# Patient Record
Sex: Male | Born: 1965 | Race: White | Hispanic: No | Smoking: Former smoker
Health system: Southern US, Community
[De-identification: ages and names within clinical notes are randomized; demographics above are authoritative.]

## PROBLEM LIST (undated history)

## (undated) DIAGNOSIS — R030 Elevated blood-pressure reading, without diagnosis of hypertension: Secondary | ICD-10-CM

## (undated) DIAGNOSIS — K219 Gastro-esophageal reflux disease without esophagitis: Secondary | ICD-10-CM

## (undated) DIAGNOSIS — I10 Essential (primary) hypertension: Secondary | ICD-10-CM

## (undated) DIAGNOSIS — E785 Hyperlipidemia, unspecified: Secondary | ICD-10-CM

## (undated) DIAGNOSIS — R0600 Dyspnea, unspecified: Secondary | ICD-10-CM

## (undated) DIAGNOSIS — R7303 Prediabetes: Secondary | ICD-10-CM

## (undated) DIAGNOSIS — K200 Eosinophilic esophagitis: Secondary | ICD-10-CM

## (undated) DIAGNOSIS — F419 Anxiety disorder, unspecified: Secondary | ICD-10-CM

## (undated) DIAGNOSIS — M1990 Unspecified osteoarthritis, unspecified site: Secondary | ICD-10-CM

## (undated) HISTORY — DX: Eosinophilic esophagitis: K20.0

## (undated) HISTORY — DX: Gastro-esophageal reflux disease without esophagitis: K21.9

## (undated) HISTORY — DX: Essential (primary) hypertension: I10

## (undated) HISTORY — PX: KNEE SURGERY: SHX244

---

## 2011-07-29 ENCOUNTER — Emergency Department (HOSPITAL_COMMUNITY): Payer: BC Managed Care – PPO

## 2011-07-29 ENCOUNTER — Encounter (HOSPITAL_COMMUNITY): Payer: Self-pay | Admitting: *Deleted

## 2011-07-29 ENCOUNTER — Emergency Department (HOSPITAL_COMMUNITY)
Admission: EM | Admit: 2011-07-29 | Discharge: 2011-07-30 | Disposition: A | Discharge status: Home / Self Care | Payer: BC Managed Care – PPO | Attending: Gastroenterology | Admitting: Gastroenterology

## 2011-07-29 ENCOUNTER — Encounter (HOSPITAL_COMMUNITY): Admission: EM | Disposition: A | Discharge status: Home / Self Care | Payer: Self-pay | Source: Home / Self Care

## 2011-07-29 DIAGNOSIS — IMO0002 Reserved for concepts with insufficient information to code with codable children: Secondary | ICD-10-CM | POA: Insufficient documentation

## 2011-07-29 DIAGNOSIS — Z79899 Other long term (current) drug therapy: Secondary | ICD-10-CM | POA: Insufficient documentation

## 2011-07-29 DIAGNOSIS — K219 Gastro-esophageal reflux disease without esophagitis: Secondary | ICD-10-CM | POA: Insufficient documentation

## 2011-07-29 DIAGNOSIS — R070 Pain in throat: Secondary | ICD-10-CM | POA: Insufficient documentation

## 2011-07-29 DIAGNOSIS — T18108A Unspecified foreign body in esophagus causing other injury, initial encounter: Secondary | ICD-10-CM | POA: Insufficient documentation

## 2011-07-29 DIAGNOSIS — R1314 Dysphagia, pharyngoesophageal phase: Secondary | ICD-10-CM | POA: Insufficient documentation

## 2011-07-29 DIAGNOSIS — E785 Hyperlipidemia, unspecified: Secondary | ICD-10-CM | POA: Insufficient documentation

## 2011-07-29 HISTORY — DX: Hyperlipidemia, unspecified: E78.5

## 2011-07-29 HISTORY — DX: Elevated blood-pressure reading, without diagnosis of hypertension: R03.0

## 2011-07-29 HISTORY — PX: ESOPHAGOGASTRODUODENOSCOPY: SHX5428

## 2011-07-29 SURGERY — EGD (ESOPHAGOGASTRODUODENOSCOPY)
Anesthesia: Moderate Sedation

## 2011-07-29 MED ORDER — GLUCAGON HCL (RDNA) 1 MG IJ SOLR
1.0000 mg | Freq: Once | INTRAMUSCULAR | Status: AC
  Administered 2011-07-29: 1 mg via INTRAVENOUS
  Filled 2011-07-29: qty 1

## 2011-07-29 MED ORDER — MIDAZOLAM HCL 10 MG/2ML IJ SOLN
INTRAMUSCULAR | Status: AC
  Filled 2011-07-29: qty 2

## 2011-07-29 MED ORDER — FENTANYL CITRATE 0.05 MG/ML IJ SOLN
INTRAMUSCULAR | Status: AC
  Filled 2011-07-29: qty 2

## 2011-07-29 NOTE — ED Notes (Signed)
The pt was eating cube steak 1-2 hours ago and the meat became stuck inhis esophagus.  When he swallow water the water returns and will not go down.  He has had in the past

## 2011-07-29 NOTE — ED Notes (Signed)
Pt transported to radiology for xray, endoscopy here to take pt to endoscopy. Pt aware family aware. Endoscopy told to call back down for bed placement if necessary in ED once procedure over if not pt will be discharged per Endoscopy.

## 2011-07-29 NOTE — ED Notes (Signed)
Pt states that he was eating cube steak about 2 hours ago and the meat lodged in his throat. States that he stuck his finger down his throat in hopes that he could remove it but was unsuccessful. States that drinking water did not help either. States that when he drunk water it would just come back up. Pt states that this has happened 3-4 times in the past. States that he thought he chewed the meat well.

## 2011-07-29 NOTE — ED Notes (Signed)
Vital signs stable. 

## 2011-07-29 NOTE — ED Provider Notes (Signed)
I saw and evaluated the patient, reviewed the resident's note and I agree with the findings and plan.  Nicholes Stairs, MD 07/29/11 2320

## 2011-07-29 NOTE — ED Provider Notes (Signed)
History     CSN: [id number]  Arrival date & time 07/29/11  2045   First MD Initiated Contact with Patient 07/29/11 2113      Chief Complaint  Patient presents with  . Swallowed Foreign Body    (Consider location/radiation/quality/duration/timing/severity/associated sxs/prior treatment) HPI Comments: Patient was eating cubed steak at home when he felt the food get stuck in his throat. Tried swallowing some water to get it down but was unsuccessful.   Patient is a 46 y.o. male presenting with foreign body swallowed. The history is provided by the patient.  Swallowed Foreign Body This is a new problem. The current episode started today (about 1h 20 minutes prior to arrival). The problem occurs constantly. The problem has been unchanged. Associated symptoms include a sore throat (mild). Pertinent negatives include no abdominal pain, arthralgias, change in bowel habit, chest pain, chills, congestion, coughing, diaphoresis, fatigue, fever, headaches, nausea, neck pain, numbness, rash, vomiting or weakness. The symptoms are aggravated by drinking. He has tried drinking for the symptoms. The treatment provided no relief.    History reviewed. No pertinent past medical history.  History reviewed. No pertinent past surgical history.  History reviewed. No pertinent family history.  History  Substance Use Topics  . Smoking status: Former Games developer  . Smokeless tobacco: Not on file  . Alcohol Use: Yes      Review of Systems  Constitutional: Negative for fever, chills, diaphoresis, activity change, appetite change and fatigue.  HENT: Positive for sore throat (mild). Negative for congestion, rhinorrhea, neck pain and neck stiffness.   Eyes: Negative for photophobia, redness and visual disturbance.  Respiratory: Negative for cough, shortness of breath and wheezing.   Cardiovascular: Negative for chest pain, palpitations and leg swelling.  Gastrointestinal: Negative for nausea, vomiting,  abdominal pain and change in bowel habit.  Musculoskeletal: Negative for back pain and arthralgias.  Skin: Negative for rash and wound.  Neurological: Negative for speech difficulty, weakness, light-headedness, numbness and headaches.  Psychiatric/Behavioral: Negative for confusion.  All other systems reviewed and are negative.    Allergies  Simvastatin  Home Medications   Current Outpatient Rx  Name Route Sig Dispense Refill  . ATORVASTATIN CALCIUM 10 MG PO TABS Oral Take 20 mg by mouth at bedtime.      BP 144/105  Pulse 88  Temp(Src) 98 F (36.7 C) (Oral)  Resp 15  SpO2 100%  Physical Exam  Nursing note and vitals reviewed. Constitutional: He is oriented to person, place, and time. He appears well-developed and well-nourished.  Non-toxic appearance. No distress.  HENT:  Head: Normocephalic and atraumatic.  Mouth/Throat: Oropharynx is clear and moist.  Eyes: Conjunctivae and EOM are normal. Pupils are equal, round, and reactive to light. No scleral icterus.  Neck: Normal range of motion. Neck supple. No JVD present. No tracheal deviation present.  Cardiovascular: Normal rate, regular rhythm, normal heart sounds and intact distal pulses.   No murmur heard. Pulmonary/Chest: Effort normal and breath sounds normal. No stridor. No respiratory distress. He has no wheezes. He has no rales.  Abdominal: Soft. Bowel sounds are normal. He exhibits no distension. There is no tenderness. There is no rebound and no guarding.  Musculoskeletal: Normal range of motion.  Neurological: He is alert and oriented to person, place, and time. He has normal strength. No cranial nerve deficit. GCS eye subscore is 4. GCS verbal subscore is 5. GCS motor subscore is 6.  Skin: Skin is warm and dry. No rash noted. He is not diaphoretic.  Psychiatric: He has a normal mood and affect.    ED Course  Procedures (including critical care time)  Labs Reviewed - No data to display No results found.   1.  Esophageal foreign body       MDM  46yo CM with PMH significant for HLD and GERD who presents to the ED due to possible esophageal foreign body. Pt was eating dinner (some cubed steak) swallowed one piece and felt like he couldn't get it all the way down and it feels like it it stuck in his throat. Pt well-appearing on exam in no distress. Sats 99% on RA. Normal lung sounds and good air movement. No stridor. No difficulty handing his oral secretions. He has had several similar episodes in the past but was able to resolve at home with water.  Will attempt glucagon.   At 10:36 PM pt reassessed able to swallow some water with pain. Some water he spit back up but definitely was able to keep some down. Getting xray to r/o obstructing mass.   GI consulted.  At 11:00 PM spoke with GI. GI coming to evaluate in ED.      Verne Carrow, MD 07/29/11 [phone]

## 2011-07-29 NOTE — ED Provider Notes (Signed)
I saw and evaluated the patient, reviewed the resident's note and I agree with the findings and plan. The patient is a 46 year old, male, who complains of inability to swallow and drink water after he was eating steak tonight.  He has had intermittent mid chest discomfort, associated with this.  He denies chest pain at this time.  However.  He has not had a nausea, vomiting.  He denies any weight loss or night sweats.  He denies changes in his voice.  He has had this in the past, but that's gone away after his drinking water.  He has never been seen by a gastroenterologist, for this.  Following use of glucagon.  His symptoms have not been relieved.  Therefore, we insulted gastroenterology.  The gastroenterologist, will come, evaluate him in the emergency department  Nicholes Stairs, MD 07/29/11 2314

## 2011-07-29 NOTE — H&P (Signed)
  HPI:  46 yo wm with chronic heartburn sxs and intermittent dysphagia. He ate cube steak several hours ago and it became lodged in the esophagus, he has not been able to pass it. Uses intermittent ranitidine OTC for heartburn. Was given glucagon in the ED without relief.  Allergies  Allergen Reactions  . Simvastatin Other (See Comments)    Joint pain   @ENCMEDSTART @ Past Medical History  Diagnosis Date  . Hyperlipidemia    History reviewed. No pertinent past surgical history. History   Social History  . Marital Status: Married    Spouse Name: N/A    Number of Children: N/A  . Years of Education: N/A   Social History Main Topics  . Smoking status: Former Games developer  . Smokeless tobacco: None  . Alcohol Use: Yes             Wt Readings from Last 3 Encounters:  No data found for Wt   Temp Readings from Last 3 Encounters:  07/29/11 98 F (36.7 C) Oral  07/29/11 98 F (36.7 C) Oral   BP Readings from Last 3 Encounters:  07/29/11 144/105  07/29/11 144/105   Pulse Readings from Last 3 Encounters:  07/29/11 95  07/29/11 95    See pre-sedation exam for physical.  A/P:  Dysphagia, heartburn/GERD with food impaction obstructing esophagus.  Will perform urgent endoscopy and food impaction removal.  The risks and benefits as well as alternatives of endoscopic procedure(s) have been discussed and reviewed. All questions answered. The patient agrees to proceed.  Iva Boop, MD, [facility] [phone] (pager) 07/29/2011 11:53 PM

## 2011-07-30 ENCOUNTER — Encounter (HOSPITAL_COMMUNITY): Payer: Self-pay | Admitting: Internal Medicine

## 2011-07-30 ENCOUNTER — Other Ambulatory Visit: Payer: Self-pay | Admitting: Internal Medicine

## 2011-07-30 DIAGNOSIS — R1314 Dysphagia, pharyngoesophageal phase: Secondary | ICD-10-CM

## 2011-07-30 DIAGNOSIS — T18108A Unspecified foreign body in esophagus causing other injury, initial encounter: Secondary | ICD-10-CM

## 2011-07-30 MED ORDER — BUTAMBEN-TETRACAINE-BENZOCAINE 2-2-14 % EX AERO
INHALATION_SPRAY | CUTANEOUS | Status: DC | PRN
  Administered 2011-07-30: 2 via TOPICAL

## 2011-07-30 MED ORDER — OMEPRAZOLE MAGNESIUM 20 MG PO TBEC: 20.0000 mg | DELAYED_RELEASE_TABLET | Freq: Every day | ORAL | Status: DC

## 2011-07-30 MED ORDER — MIDAZOLAM HCL 10 MG/2ML IJ SOLN
INTRAMUSCULAR | Status: DC | PRN
  Administered 2011-07-30 (×2): 2 mg via INTRAVENOUS

## 2011-07-30 MED ORDER — FENTANYL NICU IV SYRINGE 50 MCG/ML
INJECTION | INTRAMUSCULAR | Status: DC | PRN
  Administered 2011-07-30: 25 ug via INTRAVENOUS
  Administered 2011-07-30: 50 ug via INTRAVENOUS

## 2011-07-30 NOTE — Brief Op Note (Signed)
07/29/2011 - 07/30/2011  12:32 AM  PATIENT:  Dominic Jackson  46 y.o. male  PRE-OPERATIVE DIAGNOSIS:  food impaction  POST-OPERATIVE DIAGNOSIS:  food impaction   PROCEDURE:  Procedure(s): Esophagoscopy with foreign body removal and biopsy  SURGEON:  Surgeon(s): Iva Boop, MD    Food impaction removed. No stricture seen. Biopsies taken because of findings suggestive of eosinophilic esophagitis.  He is to start a PPI and will followup with me in the office.

## 2011-07-30 NOTE — Op Note (Signed)
Upper endoscopy procedure report   Procedure: Esophagoscopy, foreign body removal, biopsies  Indications: Food impaction in the setting of recurrent dysphagia  Medications: Intravenous fentanyl 75 mcg, intravenous Versed 5 mg, topical Cetacaine spray  After informed consent the patient was given sedation, while in the left lateral decubitus position.  Description of procedure:  The Pentax video gastroscope was inserted under direct vision into the esophagus. There were several pieces of meat sitting in the esophagus. Using gentle pressure and manipulation with the gastroscope, I was able advanced these into the stomach. The proximal stomach was viewed and had some retained food but looked normal. Retroflexion in the stomach showed no abnormalities. The scope was withdrawn into the esophagus. I saw no obvious stricture. There was no significant trauma to the esophagus. There were multiple linear fissures, subtle rings, white dots. This was suggestive of eosinophilic esophagitis. Multiple biopsies were obtained and sent to pathology.  The scope was withdrawn and there were no immediate complications.  Note that there was a malfunction of the Pentax endoscopic reporting system and images were lost and reporting function was not possible.  Impression:  #1 successful removal of food impaction in the esophagus #2 possible eosinophilic esophagitis, biopsies pending  Plans:  #1 liquids only tonight then soft foods tomorrow. Chew and cut all food well. #2 start Prilosec OTC or equivalent over-the-counter medication 20 mg daily. #3 await biopsies, will arrange followup once I review those.  Iva Boop, MD, [facility] [phone] (pager) 07/30/2011 12:22 AM

## 2011-08-01 ENCOUNTER — Encounter (HOSPITAL_COMMUNITY): Payer: Self-pay | Admitting: Internal Medicine

## 2011-08-03 ENCOUNTER — Telehealth: Payer: Self-pay

## 2011-08-03 MED ORDER — OMEPRAZOLE 40 MG PO CPDR: 40.0000 mg | DELAYED_RELEASE_CAPSULE | Freq: Every day | ORAL | Status: DC

## 2011-08-03 NOTE — Telephone Encounter (Signed)
Left message for pt to call back. Cell [phone]  Pt aware of Dr. Marvell Fuller comments and recommendations. Rx sent to the pharmacy. Pt scheduled to see Dr. Leone Payor 08/19/11@11 :30am. Pt aware of appt date and time.

## 2011-08-03 NOTE — Telephone Encounter (Signed)
Message copied by Michele Mcalpine on Wed Aug 03, 2011  3:39 PM ------      Message from: Stan Head E      Created: Wed Aug 03, 2011  1:24 PM       It looks like he has eosinophilic esophagitis - please notify            He needs to get rx for omeprazole 40 mg daily #30 with 1 refill and schedule an office visit to discuss this with me and arrange possible allergy evaluation and to consider additional therapy      Chew food carefully and small bites/pieces, do not eat in a hurry

## 2011-08-19 ENCOUNTER — Ambulatory Visit (INDEPENDENT_AMBULATORY_CARE_PROVIDER_SITE_OTHER): Payer: BC Managed Care – PPO | Admitting: Internal Medicine

## 2011-08-19 ENCOUNTER — Encounter: Payer: Self-pay | Admitting: Internal Medicine

## 2011-08-19 VITALS — BP 136/82 | HR 80 | Ht 67.0 in | Wt 184.0 lb

## 2011-08-19 DIAGNOSIS — K200 Eosinophilic esophagitis: Secondary | ICD-10-CM

## 2011-08-19 DIAGNOSIS — K219 Gastro-esophageal reflux disease without esophagitis: Secondary | ICD-10-CM | POA: Insufficient documentation

## 2011-08-19 NOTE — Assessment & Plan Note (Signed)
His heartburn and GERD symptoms have resolved on PPI. It sounds like he has GERD and possible eosinophilic esophagitis. Sometimes it can be difficult to sort out the tube and there is definite overlap. I have advised him to continue PPI therapy on a chronic basis at this point I would like to see him back in a year routinely, sooner if he has recurrent dysphagia. There was no stricture seen at the time of his upper endoscopy so I elected not to proceed with further endoscopy given the overall findings. I told him he may need one in the future depending upon how things go.

## 2011-08-19 NOTE — Progress Notes (Signed)
Patient ID: Dominic Jackson, male   DOB: 01/05/1966, 46 y.o.   MRN: [id number]   This pleasant man returns after we first met because of a food impaction in the esophagus on 07/29/2011. He had heartburn and recurrent dysphagia and meat impactions in general. The endoscopy demonstrated findings that were suggestive of eosinophilic esophagitis. He was started on PPI therapy on a daily basis. He has had no recurrent dysphagia. Heartburn is under much better control, minimal if at all. He also describes previous episodes of water brash that have resolved. The biopsies returned with about 25 eosinophils per high power field, suggestive of eosinophilic esophagitis.  He denies other problems at this time. He drinks about one cup of coffee and sometimes one caffeinated soft drink a day. I've explained that these can increase reflux problems. He drinks 3-4 beers a night, and was advised to change that to one or 2 a night, but to alcoholic beverages daily should be a maximum for most man on a chronic basis. He does not smoke.  Medications, allergies and past medical history updated and reviewed in the PMR.  Filed Vitals:   08/19/11 1117  BP: 136/82  Pulse: 80

## 2011-08-19 NOTE — Assessment & Plan Note (Signed)
The pathology was consistent with this. As best I can tell he is responding well to PPI therapy alone. I have given him information about eosinophilic esophagitis and explained that it isn't allergic disorder, we are often unclear as to the origin though some foods could be implicated. It is often closely associated with acid reflux as well. Since he appears well on PPI therapy alone will continue that and monitor. If he develops recurrent dysphagia, treatment for eosinophilic esophagitis more specifically with topical steroids such as fluticasone ingested, will be considered. He could need an endoscopy and possible dilation and repeat biopsies as well.

## 2011-08-19 NOTE — Patient Instructions (Signed)
It sounds like you have acid reflux disease (GERD) and may also have something called eosinophilic esophagitis. Stay on the omeprazole. Please read the GERD and Eosinophilic Esophagitis information. If you develop recurrent swallowing problems please contact us. Otherwise return for follow-up in 1 year.

## 2011-09-27 ENCOUNTER — Other Ambulatory Visit: Payer: Self-pay | Admitting: Gastroenterology

## 2011-09-27 NOTE — Telephone Encounter (Signed)
This is your pt. I think.

## 2012-09-16 ENCOUNTER — Other Ambulatory Visit: Payer: Self-pay | Admitting: Internal Medicine

## 2012-10-03 ENCOUNTER — Ambulatory Visit (INDEPENDENT_AMBULATORY_CARE_PROVIDER_SITE_OTHER): Payer: BC Managed Care – PPO | Admitting: Internal Medicine

## 2012-10-03 ENCOUNTER — Encounter: Payer: Self-pay | Admitting: Internal Medicine

## 2012-10-03 VITALS — BP 120/78 | HR 80 | Ht 67.0 in | Wt 189.0 lb

## 2012-10-03 DIAGNOSIS — K200 Eosinophilic esophagitis: Secondary | ICD-10-CM

## 2012-10-03 DIAGNOSIS — K219 Gastro-esophageal reflux disease without esophagitis: Secondary | ICD-10-CM

## 2012-10-03 MED ORDER — OMEPRAZOLE 40 MG PO CPDR: 40.0000 mg | DELAYED_RELEASE_CAPSULE | Freq: Every day | ORAL | Status: DC

## 2012-10-03 NOTE — Progress Notes (Signed)
  Subjective:    Patient ID: Dominic Jackson, male    DOB: [birth year], 47 y.o.   MRN: [id number]  HPI The patient returns for follow-up of eosinophilic esophagitis and GERD. He is fine on omeprazole 40 mg daily. No dysphagia and no heartburn.  Medications, allergies, past medical history, past surgical history, family history and social history are reviewed and updated in the EMR.  Review of Systems As above    Objective:   Physical Exam WDWN NAD    Assessment & Plan:  Eosinophilic esophagitis - Plan: omeprazole (PRILOSEC) 40 MG capsule  GERD (gastroesophageal reflux disease) - Plan: omeprazole (PRILOSEC) 40 MG capsule  He can see me as needed and obtain PPI through PCP. Will plan to contact him after he turns 49 re: scheduling a screening colonoscopy.  I appreciate the opportunity to care for this patient.  NW:[id number],YQMVHQIO, MD

## 2012-10-03 NOTE — Patient Instructions (Signed)
Glad things are going well. See me back as needed and after you turn 49 (for colonoscopy). Will try to send you a letter after you turn 49.  You should be able to get the omeprazole through Dr. Barbaraann Barthel in the future.   Thank you for choosing me and [facility].  Iva Boop, MD, [facility]

## 2012-10-26 ENCOUNTER — Other Ambulatory Visit: Payer: Self-pay | Admitting: Internal Medicine

## 2015-06-28 ENCOUNTER — Ambulatory Visit (HOSPITAL_COMMUNITY)
Admission: EM | Admit: 2015-06-28 | Discharge: 2015-06-29 | Disposition: A | Discharge status: Home / Self Care | Payer: BLUE CROSS/BLUE SHIELD | Attending: Emergency Medicine | Admitting: Emergency Medicine

## 2015-06-28 ENCOUNTER — Encounter (HOSPITAL_COMMUNITY)
Admission: EM | Disposition: A | Discharge status: Home / Self Care | Payer: Self-pay | Source: Home / Self Care | Attending: Emergency Medicine

## 2015-06-28 ENCOUNTER — Encounter (HOSPITAL_COMMUNITY): Payer: Self-pay | Admitting: Family Medicine

## 2015-06-28 DIAGNOSIS — R1310 Dysphagia, unspecified: Secondary | ICD-10-CM

## 2015-06-28 DIAGNOSIS — K200 Eosinophilic esophagitis: Secondary | ICD-10-CM | POA: Diagnosis not present

## 2015-06-28 DIAGNOSIS — M1990 Unspecified osteoarthritis, unspecified site: Secondary | ICD-10-CM | POA: Insufficient documentation

## 2015-06-28 DIAGNOSIS — Z79899 Other long term (current) drug therapy: Secondary | ICD-10-CM | POA: Insufficient documentation

## 2015-06-28 DIAGNOSIS — E785 Hyperlipidemia, unspecified: Secondary | ICD-10-CM | POA: Diagnosis not present

## 2015-06-28 DIAGNOSIS — K2210 Ulcer of esophagus without bleeding: Secondary | ICD-10-CM | POA: Diagnosis not present

## 2015-06-28 DIAGNOSIS — R1314 Dysphagia, pharyngoesophageal phase: Secondary | ICD-10-CM | POA: Diagnosis not present

## 2015-06-28 DIAGNOSIS — Z87891 Personal history of nicotine dependence: Secondary | ICD-10-CM | POA: Insufficient documentation

## 2015-06-28 DIAGNOSIS — K219 Gastro-esophageal reflux disease without esophagitis: Secondary | ICD-10-CM | POA: Diagnosis not present

## 2015-06-28 DIAGNOSIS — K449 Diaphragmatic hernia without obstruction or gangrene: Secondary | ICD-10-CM | POA: Diagnosis not present

## 2015-06-28 DIAGNOSIS — I10 Essential (primary) hypertension: Secondary | ICD-10-CM | POA: Insufficient documentation

## 2015-06-28 DIAGNOSIS — R1319 Other dysphagia: Secondary | ICD-10-CM

## 2015-06-28 DIAGNOSIS — K222 Esophageal obstruction: Secondary | ICD-10-CM | POA: Insufficient documentation

## 2015-06-28 DIAGNOSIS — T18128A Food in esophagus causing other injury, initial encounter: Secondary | ICD-10-CM | POA: Diagnosis not present

## 2015-06-28 HISTORY — PX: ESOPHAGOGASTRODUODENOSCOPY: SHX5428

## 2015-06-28 HISTORY — DX: Unspecified osteoarthritis, unspecified site: M19.90

## 2015-06-28 SURGERY — EGD (ESOPHAGOGASTRODUODENOSCOPY)
Anesthesia: Moderate Sedation

## 2015-06-28 MED ORDER — MIDAZOLAM HCL 5 MG/ML IJ SOLN
INTRAMUSCULAR | Status: AC
  Filled 2015-06-28: qty 2

## 2015-06-28 MED ORDER — MIDAZOLAM HCL 10 MG/2ML IJ SOLN
INTRAMUSCULAR | Status: DC | PRN
  Administered 2015-06-28: 2 mg via INTRAVENOUS
  Administered 2015-06-28 (×5): 1 mg via INTRAVENOUS
  Administered 2015-06-28: 2 mg via INTRAVENOUS

## 2015-06-28 MED ORDER — FENTANYL CITRATE (PF) 100 MCG/2ML IJ SOLN
INTRAMUSCULAR | Status: AC
  Filled 2015-06-28: qty 4

## 2015-06-28 MED ORDER — FENTANYL CITRATE (PF) 100 MCG/2ML IJ SOLN
INTRAMUSCULAR | Status: DC | PRN
  Administered 2015-06-28: 25 ug via INTRAVENOUS
  Administered 2015-06-28 (×2): 12.5 ug via INTRAVENOUS
  Administered 2015-06-28 (×2): 25 ug via INTRAVENOUS

## 2015-06-28 MED ORDER — DIPHENHYDRAMINE HCL 50 MG/ML IJ SOLN
INTRAMUSCULAR | Status: AC
  Filled 2015-06-28: qty 1

## 2015-06-28 MED ORDER — DIPHENHYDRAMINE HCL 50 MG/ML IJ SOLN
INTRAMUSCULAR | Status: DC | PRN
  Administered 2015-06-28: 25 mg via INTRAVENOUS

## 2015-06-28 NOTE — H&P (Signed)
Patient ID: Dominic Jackson, male DOB: 07-17-1965 MRN: [id number]  HPI The [facility] for acute dysphagia while eating a pork chop about 4 pm today. Unable to handle secretions until [facility]. He has eosinophilic esophagitis and GERD. He has not been on any treatment for either for the past years. He last saw Dr. Leone PayorGessner in early 2104. Notes frequent heartburn recently and has take Pepcid as as needed. Notes solid food dysphagia a couple times over the past few weeks.   Medications, allergies, past medical history, past surgical history, family history and social history are reviewed and updated in the EMR.  Review of Systems Dominic Jackson    Objective:  Physical Exam WDWN NAD HEENT: negative Chest: CTA Cor: RRR without murmurs Abd: soft, NT, ND, +BS Neuro: A+0 x 3. Alert,appropriate.    Assessment & Plan:  Acute dysphagia. Suspected food impaction. Urgent EGD.   Eosinophilic esophagitis - Follow up with Dr. Leone PayorGessner  GERD (gastroesophageal reflux disease) - Plan: omeprazole (PRILOSEC) OTC 20 MG capsule and follow up with Dr. Leone PayorGessner.

## 2015-06-28 NOTE — ED Notes (Signed)
Patient was transferred [facility] for consult with endoscopy due to patient having a piece of porkchop stuck in throat. Prior to transport, patient failed po challenge and had GLUCAGON 1 mg IM with no relief.

## 2015-06-28 NOTE — ED Notes (Signed)
Bed: [id number] Expected date:  Expected time:  Means of arrival:  Comments: Endo

## 2015-06-28 NOTE — ED Provider Notes (Signed)
CSN: [id number]     Arrival date & time 06/28/15  2125 History   First MD Initiated Contact with Patient 06/28/15 2132     Chief Complaint  Patient presents with  . Foreign Body     (Consider location/radiation/quality/duration/timing/severity/associated sxs/prior Treatment) HPI Comments: Patient is a 49 year old male with a history of hypertension Hyperlipidemia who presented to [facility] tonight with a food bolus impaction. He states he was eating pork chop around 6 PM and when he swallowed the food got caught in his throat. He has been unable to swallow his saliva or any liquids since. He continues to regurgitate his saliva. He is getting glucagon 1 mg IM there without relief.  He states he had one similar episode a proximally 2 years ago where he received an endoscopy and was found to have relation in the area where the food was stuck but never had to be stretched. Over the last 2 weeks he's had some intermittent episodes of difficulty swallowing and has noticed some new heartburn symptoms. He has started taking over-the-counter antacids. Patient denies any tobacco use but does drink 3-4 cans of beer per night.  The history is provided by the patient.    Past Medical History  Diagnosis Date  . Hyperlipidemia   . Borderline hypertension   . Eosinophilic esophagitis   . GERD (gastroesophageal reflux disease)   . Arthritis    Past Surgical History  Procedure Laterality Date  . Esophagogastroduodenoscopy  07/29/2011    Esophagoscopy with removal of food impaction, biopsies suggested eosinophilic esophagitis  . Knee surgery Right    Family History  Problem Relation Age of Onset  . Cancer Maternal Grandmother   . Skin cancer Father   . Diabetes Maternal Aunt   . Heart disease Maternal Grandmother    Social History  Substance Use Topics  . Smoking status: Former Games developermoker  . Smokeless tobacco: Never Used  . Alcohol Use: 0.0 oz/week    3-4 Cans of beer per week   Comment: 3-4 cans of beer a night    Review of Systems  All other systems reviewed and are negative.     Allergies  Simvastatin  Home Medications   Prior to Admission medications   Medication Sig Start Date End Date Taking? Authorizing Provider  atorvastatin (LIPITOR) 10 MG tablet Take 20 mg by mouth at bedtime.    Historical Provider, MD  losartan-hydrochlorothiazide (HYZAAR) 50-12.5 MG per tablet Take 1 tablet by mouth daily.    Historical Provider, MD  meloxicam (MOBIC) 15 MG tablet Take 15 mg by mouth daily.    Historical Provider, MD  omeprazole (PRILOSEC) 40 MG capsule Take 1 capsule (40 mg total) by mouth daily before breakfast. 10/03/12   Iva Booparl E Gessner, MD  omeprazole (PRILOSEC) 40 MG capsule TAKE ONE CAPSULE BY MOUTH EVERY DAY 10/26/12   Iva Booparl E Gessner, MD  traMADol (ULTRAM) 50 MG tablet Take 50 mg by mouth every 6 (six) hours as needed for pain.    Historical Provider, MD   BP 147/93 mmHg  Pulse 86  Temp(Src) 98.3 F (36.8 C) (Oral)  Resp 20  Ht 5\' 6"  (1.676 m)  Wt 180 lb (81.647 kg)  BMI 29.07 kg/m2  SpO2 95% Physical Exam  Constitutional: He is oriented to person, place, and time. He appears well-developed and well-nourished. No distress.  HENT:  Head: Normocephalic and atraumatic.  Mouth/Throat: Oropharynx is clear and moist.  Eyes: Conjunctivae and EOM are normal. Pupils are equal, round, and  reactive to light.  Neck: Normal range of motion. Neck supple.  Cardiovascular: Normal rate, regular rhythm and intact distal pulses.   No murmur heard. Pulmonary/Chest: Effort normal and breath sounds normal. No respiratory distress. He has no wheezes. He has no rales.  Abdominal: Soft. He exhibits no distension. There is no tenderness. There is no rebound and no guarding.  Musculoskeletal: Normal range of motion. He exhibits no edema or tenderness.  Neurological: He is alert and oriented to person, place, and time.  Skin: Skin is warm and dry. No rash noted. No  erythema.  Psychiatric: He has a normal mood and affect. His behavior is normal.  Nursing note and vitals reviewed.   ED Course  Procedures (including critical care time) Labs Review Labs Reviewed - No data to display  Imaging Review No results found. I have personally reviewed and evaluated these images and lab results as part of my medical decision-making.   EKG Interpretation None      MDM   Final diagnoses:  None    Patient being sent here from [facility] for food bolus impaction. Patient was eating a pork chop tonight at 6 PM and has been unable to swallow since. He is still regurgitating all of his saliva despite getting glucagon at [facility]. He otherwise has no respiratory complaint is in no acute distress. Spoke with GI who will come and see the patient.    Gwyneth Sprout, MD 06/28/15 2211

## 2015-06-28 NOTE — Op Note (Signed)
[facility] [address] [place], [id number]   ENDOSCOPY PROCEDURE REPORT  PATIENT: Dominic Jackson, Dominic Jackson  MR#: [id number] BIRTHDATE: Sep 24, 1965 , 49  yrs. old GENDER: male ENDOSCOPIST: Meryl DareMalcolm T Klinton Candelas, MD, [facility] REFERRED BY:  Cynda AcresWLED PROCEDURE DATE:  06/28/2015 PROCEDURE:  EGD, diagnostic ASA CLASS:     Class II INDICATIONS:  dysphagia and food impaction. MEDICATIONS: Benadryl 25 mg IV, Fentanyl 100 mcg IV, and Versed 9 mg IV TOPICAL ANESTHETIC: none DESCRIPTION OF PROCEDURE: After the risks benefits and alternatives of the procedure were thoroughly explained, informed consent was obtained.  The Pentax Gastroscope [id number] endoscope was introduced through the mouth and advanced to the second portion of the duodenum , Without limitations.  The instrument was slowly withdrawn as the mucosa was fully examined.    ESOPHAGUS: Two erosions were found in the distal esophagus.  There was a benign appearing stricture at the gastroesophageal junction. The stricture was easily traversable.  No food impaction, likely passed in [facility]. STOMACH: The mucosa of the stomach appeared normal. DUODENUM: The duodenal mucosa showed no abnormalities in the bulb and 2nd part of the duodenum.  Retroflexed views revealed a small hiatal hernia.  The scope was then withdrawn from the patient and the procedure completed.  COMPLICATIONS: There were no immediate complications.  ENDOSCOPIC IMPRESSION: 1.   Two erosions in the distal esophagus 2.   Stricture at the gastroesophageal junction 3.   Small hiatal hernia  RECOMMENDATIONS: 1.  Anti-reflux regimen long term 2.  PPI qam: Prilosec OTC omeprazole 20 mg po qam 3.  Soft foods only, no meats or bread 4.  OP follow-up with Dr. Leone PayorGessner  eSigned:  Meryl DareMalcolm T Zaleah Ternes, MD, [facility] 06/28/2015 11:42 PM   CC: Stan Headarl Gessner, MD

## 2015-06-28 NOTE — ED Notes (Signed)
Endo at bedside

## 2015-06-29 ENCOUNTER — Encounter (HOSPITAL_COMMUNITY): Payer: Self-pay | Admitting: Gastroenterology

## 2015-06-29 NOTE — ED Notes (Signed)
Patient resting with eyes shut.

## 2015-06-29 NOTE — ED Notes (Signed)
Pollina, MD at bedside 

## 2015-06-29 NOTE — ED Notes (Signed)
Discharge instructions reviewed with patient and wife. Patient and wife verbalized understanding.

## 2015-06-29 NOTE — ED Provider Notes (Signed)
Recheck at 01:12  Patient awake and alert, appropriate for discharge status post sedation for endoscopy.  Gilda Creasehristopher J Pollina, MD 06/29/15 [phone]

## 2015-06-29 NOTE — Discharge Instructions (Signed)
Esophagogastroduodenoscopy °Esophagogastroduodenoscopy (EGD) is a procedure that is used to examine the lining of the esophagus, stomach, and first part of the small intestine (duodenum). A long, flexible, lighted tube with a camera attached (endoscope) is inserted down the throat to view these organs. This procedure is done to detect problems or abnormalities, such as inflammation, bleeding, ulcers, or growths, in order to treat them. The procedure lasts 5-20 minutes. It is usually an outpatient procedure, but it may need to be performed in a hospital in emergency cases. °LET YOUR HEALTH CARE PROVIDER KNOW ABOUT: °· Any allergies you have. °· All medicines you are taking, including vitamins, herbs, eye drops, creams, and over-the-counter medicines. °· Previous problems you or members of your family have had with the use of anesthetics. °· Any blood disorders you have. °· Previous surgeries you have had. °· Medical conditions you have. °RISKS AND COMPLICATIONS °Generally, this is a safe procedure. However, problems can occur and include: °· Infection. °· Bleeding. °· Tearing (perforation) of the esophagus, stomach, or duodenum. °· Difficulty breathing or not being able to breathe. °· Excessive sweating. °· Spasms of the larynx. °· Slowed heartbeat. °· Low blood pressure. °BEFORE THE PROCEDURE °· Do not eat or drink anything after midnight on the night before the procedure or as directed by your health care provider. °· Do not take your regular medicines before the procedure if your health care provider asks you not to. Ask your health care provider about changing or stopping those medicines. °· If you wear dentures, be prepared to remove them before the procedure. °· Arrange for someone to drive you home after the procedure. °PROCEDURE °· A numbing medicine (local anesthetic) may be sprayed in your throat for comfort and to stop you from gagging or coughing. °· You will have an IV tube inserted in a vein in your  hand or arm. You will receive medicines and fluids through this tube. °· You will be given a medicine to relax you (sedative). °· A pain reliever will be given through the IV tube. °· A mouth guard may be placed in your mouth to protect your teeth and to keep you from biting on the endoscope. °· You will be asked to lie on your left side. °· The endoscope will be inserted down your throat and into your esophagus, stomach, and duodenum. °· Air will be put through the endoscope to allow your health care provider to clearly view the lining of your esophagus. °· The lining of your esophagus, stomach, and duodenum will be examined. During the exam, your health care provider may: °¨ Remove tissue to be examined under a microscope (biopsy) for inflammation, infection, or other medical problems. °¨ Remove growths. °¨ Remove objects (foreign bodies) that are stuck. °¨ Treat any bleeding with medicines or other devices that stop tissues from bleeding (hot cautery, clipping devices). °¨ Widen (dilate) or stretch narrowed areas of your esophagus and stomach. °· The endoscope will be withdrawn. °AFTER THE PROCEDURE °· You will be taken to a recovery area for observation. Your blood pressure, heart rate, breathing rate, and blood oxygen level will be monitored often until the medicines you were given have worn off. °· Do not eat or drink anything until the numbing medicine has worn off and your gag reflex has returned. You may choke. °· Your health care provider should be able to discuss his or her findings with you. It will take longer to discuss the test results if any biopsies were taken. °  °  This information is not intended to replace advice given to you by your health care provider. Make sure you discuss any questions you have with your health care provider. °  °Document Released: 10/28/2004 Document Revised: 07/18/2014 Document Reviewed: 05/30/2012 °Elsevier Interactive Patient Education ©2016 [employer] ° °

## 2015-07-01 ENCOUNTER — Encounter: Payer: Self-pay | Admitting: Internal Medicine

## 2015-07-01 ENCOUNTER — Ambulatory Visit (INDEPENDENT_AMBULATORY_CARE_PROVIDER_SITE_OTHER): Payer: BLUE CROSS/BLUE SHIELD | Admitting: Internal Medicine

## 2015-07-01 VITALS — BP 120/90 | HR 84 | Ht 67.0 in | Wt 192.0 lb

## 2015-07-01 DIAGNOSIS — K219 Gastro-esophageal reflux disease without esophagitis: Secondary | ICD-10-CM | POA: Diagnosis not present

## 2015-07-01 DIAGNOSIS — K222 Esophageal obstruction: Secondary | ICD-10-CM

## 2015-07-01 DIAGNOSIS — K200 Eosinophilic esophagitis: Secondary | ICD-10-CM

## 2015-07-01 MED ORDER — PANTOPRAZOLE SODIUM 40 MG PO TBEC: 40.0000 mg | DELAYED_RELEASE_TABLET | Freq: Every day | ORAL | Status: DC

## 2015-07-01 NOTE — Progress Notes (Signed)
   Subjective:    Patient ID: Dominic Jackson, male    DOB: [birth year], 49 y.o.   MRN: [id number] Chief complaint: Dysphagia and recent food impaction HPI The patient is a 49 year old white man I know from previous exam an upper endoscopy. He had suspected GERD plus minus eosinophilic esophagitis. He had a food impaction a few days ago and this was treated by Dr. Russella DarStark. A stricture and some erosions are seen in the distal esophagus. The patient had stopped taking his PPI. He thought that omeprazole is causing him to have a clear white phlegm produced from his esophagus. He was taking Pepcid instead. Prior to the food impaction he did notice intermittent increased dysphagia. Wt Readings from Last 3 Encounters:  07/01/15 192 lb (87.091 kg)  06/28/15 180 lb (81.647 kg)  10/03/12 189 lb (85.73 kg)  Medications, allergies, past medical history, past surgical history, family history and social history are reviewed and updated in the EMR.    Review of Systems As above    Objective:   Physical Exam BP 120/90 mmHg  Pulse 84  Ht 5\' 7"  (1.702 m)  Wt 192 lb (87.091 kg)  BMI 30.06 kg/m2    Assessment & Plan:   1. GERD with stricture   2. Eosinophilic esophagitis    Restart PPI, pantoprazole 40 mg daily. Reemphasized the importance of staying on one of these medications to avoid recurrent dysphagia and stricture formation. Also helps if he does have eosinophilic esophagitis Schedule upper GI endoscopy with likely esophageal dilation possible repeat biopsies The risks and benefits as well as alternatives of endoscopic procedure(s) have been discussed and reviewed. All questions answered. The patient agrees to proceed.  Chew carefully and the interim  He is 49, we will call him and offer the chance to do a colonoscopy for screening at the time of his EGD. I did not make that connection when he was here in the office today.  I appreciate the opportunity to care for this patient. CC: Clayborn HeronVictoria R  Rankins, MD

## 2015-07-01 NOTE — Patient Instructions (Signed)
  You have been scheduled for an endoscopy. Please follow written instructions given to you at your visit today. If you use inhalers (even only as needed), please bring them with you on the day of your procedure. Your physician has requested that you go to www.startemmi.com and enter the access code given to you at your visit today. This web site gives a general overview about your procedure. However, you should still follow specific instructions given to you by our office regarding your preparation for the procedure.   We have sent the following medications to your pharmacy for you to pick up at your convenience: Pantoprazole  Chew your food carefully.   I appreciate the opportunity to care for you. Stan Headarl Gessner, MD, [facility]

## 2015-07-02 ENCOUNTER — Telehealth: Payer: Self-pay

## 2015-07-02 NOTE — Telephone Encounter (Signed)
Patient said he did a stool test  for blood for the [employer]. And it was negative so they told him he didn't need a colonoscopy.  This was done in June 2016.  He will do what ever you advise Sir.

## 2015-07-02 NOTE — Telephone Encounter (Signed)
-----   Message from Iva Booparl E Gessner, MD sent at 07/01/2015  6:03 PM EST ----- Regarding: colonoscopy also? I realized that the time of dictation he is 49 and has not had a colonoscopy  He will be having an EGD. If he wants we could add on a screening colonoscopy. Thanks

## 2015-07-02 NOTE — Telephone Encounter (Signed)
That is ok for this year he is right so he can do the stool test again next year or start colonoscopy next year - [place] [facility]

## 2015-07-02 NOTE — Telephone Encounter (Signed)
Left patient message to call me back to see if he wants to do a colonoscopy.

## 2015-07-02 NOTE — Telephone Encounter (Signed)
Patient informed. 

## 2015-07-15 ENCOUNTER — Encounter: Payer: Self-pay | Admitting: Internal Medicine

## 2015-07-15 ENCOUNTER — Ambulatory Visit (AMBULATORY_SURGERY_CENTER): Payer: BLUE CROSS/BLUE SHIELD | Admitting: Internal Medicine

## 2015-07-15 VITALS — BP 129/90 | HR 65 | Temp 98.0°F | Resp 28 | Ht 67.0 in | Wt 192.0 lb

## 2015-07-15 DIAGNOSIS — K200 Eosinophilic esophagitis: Secondary | ICD-10-CM | POA: Diagnosis not present

## 2015-07-15 DIAGNOSIS — K219 Gastro-esophageal reflux disease without esophagitis: Secondary | ICD-10-CM

## 2015-07-15 DIAGNOSIS — K222 Esophageal obstruction: Secondary | ICD-10-CM

## 2015-07-15 MED ORDER — SODIUM CHLORIDE 0.9 % IV SOLN: 500.0000 mL | INTRAVENOUS | Status: DC

## 2015-07-15 NOTE — Patient Instructions (Addendum)
I took biopsies of the esophagus and dilated it to avoid future swallowing problems.  PLEASE STAY ON PANTOPRAZOLE  I will let you know pathology results and follow-up plans - it looks like you still have the eosinophilic esophagitis.  I appreciate the opportunity to care for you. Iva Boop, MD, FACG  YOU HAD AN ENDOSCOPIC PROCEDURE TODAY AT THE  ENDOSCOPY CENTER:   Refer to the procedure report that was given to you for any specific questions about what was found during the examination.  If the procedure report does not answer your questions, please call your gastroenterologist to clarify.  If you requested that your care partner not be given the details of your procedure findings, then the procedure report has been included in a sealed envelope for you to review at your convenience later.  YOU SHOULD EXPECT: Some feelings of bloating in the abdomen. Passage of more gas than usual.  Walking can help get rid of the air that was put into your GI tract during the procedure and reduce the bloating. If you had a lower endoscopy (such as a colonoscopy or flexible sigmoidoscopy) you may notice spotting of blood in your stool or on the toilet paper. If you underwent a bowel prep for your procedure, you may not have a normal bowel movement for a few days.  Please Note:  You might notice some irritation and congestion in your nose or some drainage.  This is from the oxygen used during your procedure.  There is no need for concern and it should clear up in a day or so.  SYMPTOMS TO REPORT IMMEDIATELY:   Following lower endoscopy (colonoscopy or flexible sigmoidoscopy):  Excessive amounts of blood in the stool  Significant tenderness or worsening of abdominal pains  Swelling of the abdomen that is new, acute  Fever of 100F or higher   Following upper endoscopy (EGD)  Vomiting of blood or coffee ground material  New chest pain or pain under the shoulder blades  Painful or  persistently difficult swallowing  New shortness of breath  Fever of 100F or higher  Black, tarry-looking stools  For urgent or emergent issues, a gastroenterologist can be reached at any hour by calling [phone].   DIET: Your first meal following the procedure should be a small meal and then it is ok to progress to your normal diet. Heavy or fried foods are harder to digest and may make you feel nauseous or bloated.  Likewise, meals heavy in dairy and vegetables can increase bloating.  Drink plenty of fluids but you should avoid alcoholic beverages for 24 hours.  ACTIVITY:  You should plan to take it easy for the rest of today and you should NOT DRIVE or use heavy machinery until tomorrow (because of the sedation medicines used during the test).    FOLLOW UP: Our staff will call the number listed on your records the next business day following your procedure to check on you and address any questions or concerns that you may have regarding the information given to you following your procedure. If we do not reach you, we will leave a message.  However, if you are feeling well and you are not experiencing any problems, there is no need to return our call.  We will assume that you have returned to your regular daily activities without incident.  If any biopsies were taken you will be contacted by phone or by letter within the next 1-3 weeks.  Please call  us at [phone] if you have not heard about the biopsies in 3 weeks.    SIGNATURES/CONFIDENTIALITY: You and/or your care partner have signed paperwork which will be entered into your electronic medical record.  These signatures attest to the fact that that the information above on your After Visit Summary has been reviewed and is understood.  Full responsibility of the confidentiality of this discharge information lies with you and/or your care-partner.  After dilation diet given with instructions.  Continue PPI  If donig welll can  likely stay on pantoprazole and RTC one year.  Esophagitis information given.

## 2015-07-15 NOTE — Progress Notes (Signed)
Called to room to assist during endoscopic procedure.  Patient ID and intended procedure confirmed with present staff. Received instructions for my participation in the procedure from the performing physician.  

## 2015-07-15 NOTE — Op Note (Signed)
[facility] [address] [place], [id number]   ENDOSCOPY PROCEDURE REPORT  PATIENT: Delorse LimberMoorefield, Urijah  MR#: [id number] BIRTHDATE: Nov 26, 1965 , 49  yrs. old GENDER: male ENDOSCOPIST: Iva Booparl E Gessner, MD, [facility] PROCEDURE DATE:  07/15/2015 PROCEDURE:  EGD w/ biopsy and Maloney dilation of esophagus ASA CLASS:     Class II INDICATIONS:  dysphagia, hx GERD/Eosinophilic esophagitis + stricture. MEDICATIONS: Propofol 200 mg IV and Monitored anesthesia care TOPICAL ANESTHETIC: none  DESCRIPTION OF PROCEDURE: After the risks benefits and alternatives of the procedure were thoroughly explained, informed consent was obtained.  The LB [id number] endoscope was introduced through the mouth and advanced to the second portion of the duodenum , Without limitations.  The instrument was slowly withdrawn as the mucosa was fully examined.    1) Fissures and furrows in esophagus and a few distal rings - consistent w/ eosinophilic esophagitis.  Biopsies taken and 54 Fr Maloney dilator easily passed + heme after bxs.  Appropriate dilation effect seen in distal esophagus on re-inspection. 2) Remainder of exam normal.  Retroflexed views revealed no abnormalities.     The scope was then withdrawn from the patient and the procedure completed.  COMPLICATIONS: There were no immediate complications.  ENDOSCOPIC IMPRESSION: 1) Fissures and furrows in esophagus and a few distal rings - consistent w/ eosinophilic esophagitis.  Biopsies taken and 54 Fr Maloney dilator easily passed + heme after bxs.  Appropriate dilation effect seen in distal esophagus on re-inspection. 2) Remainder of exam normal  RECOMMENDATIONS: 1.  Clear liquids until 4PM  , then soft foods rest of day.  Resume prior diet tomorrow. 2.  Continue PPI 3.  If doing well can likely stay on pantoprazole and RTC 1 yr   eSigned:  Iva Booparl E Gessner, MD, [facility] 07/15/2015 2:44 PM    CC:The Patient and Beverley FiedlerVictoria  Rankins, MD

## 2015-07-15 NOTE — Progress Notes (Signed)
A/ox3 pleased with MAC, report to Jane RN 

## 2015-07-16 ENCOUNTER — Telehealth: Payer: Self-pay | Admitting: *Deleted

## 2015-07-16 NOTE — Telephone Encounter (Signed)
  Follow up Call-  Call back number 07/15/2015  Post procedure Call Back phone  # [phone]  Permission to leave phone message Yes     Patient questions:  Do you have a fever, pain , or abdominal swelling? No. Pain Score  0 *  Have you tolerated food without any problems? Yes.    Have you been able to return to your normal activities? Yes.    Do you have any questions about your discharge instructions: Diet   No. Medications  No. Follow up visit  No.  Do you have questions or concerns about your Care? No.  Actions: * If pain score is 4 or above: No action needed, pain <4.

## 2015-07-22 ENCOUNTER — Encounter: Payer: Self-pay | Admitting: Internal Medicine

## 2015-07-22 NOTE — Progress Notes (Signed)
Quick Note:  Looks like EoE - stay on PPI See me 1 year but return sooner if still having swallowing difficulty Letter out to patient ______

## 2015-08-10 ENCOUNTER — Encounter: Payer: Self-pay | Admitting: Internal Medicine

## 2015-11-25 IMAGING — CR DG SHOULDER 2+V*R*
3 series · 3 of 3 positions shown · non-contrast
Comparison: None.

CLINICAL DATA: Right shoulder injury yesterday.

EXAM:
RIGHT SHOULDER - 2+ VIEW

[AP]
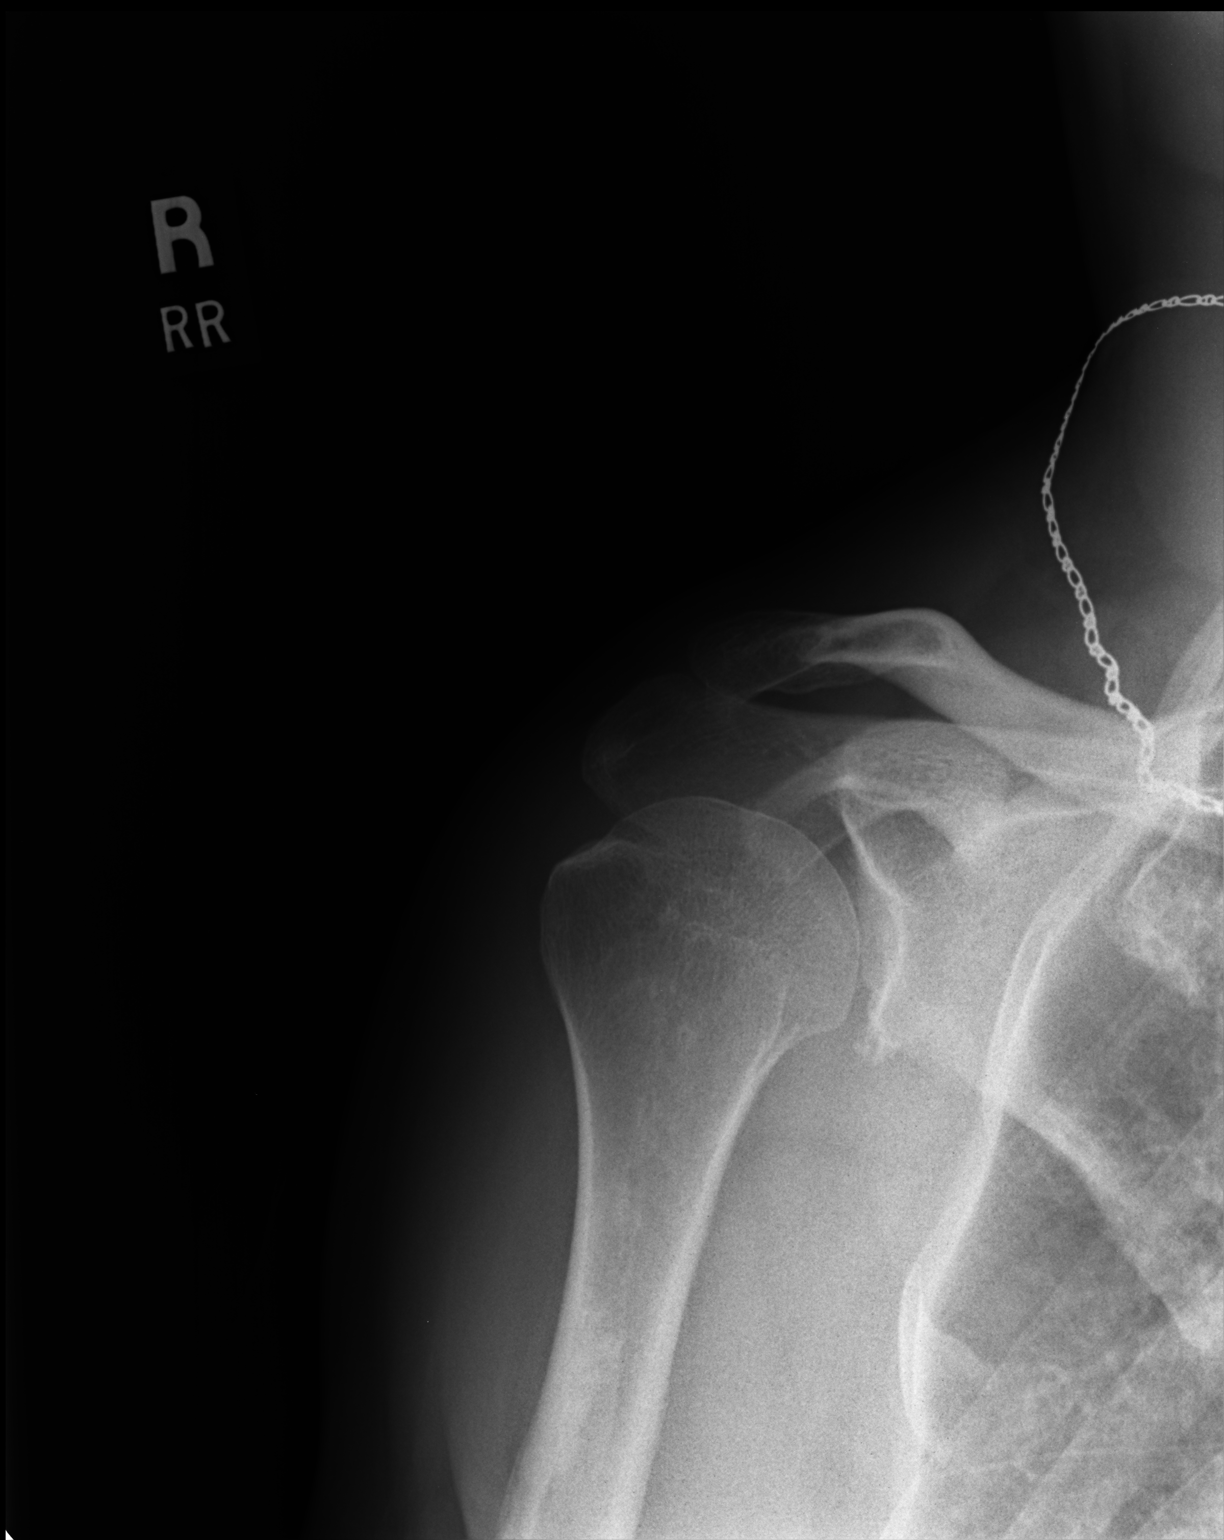

[pa y view]
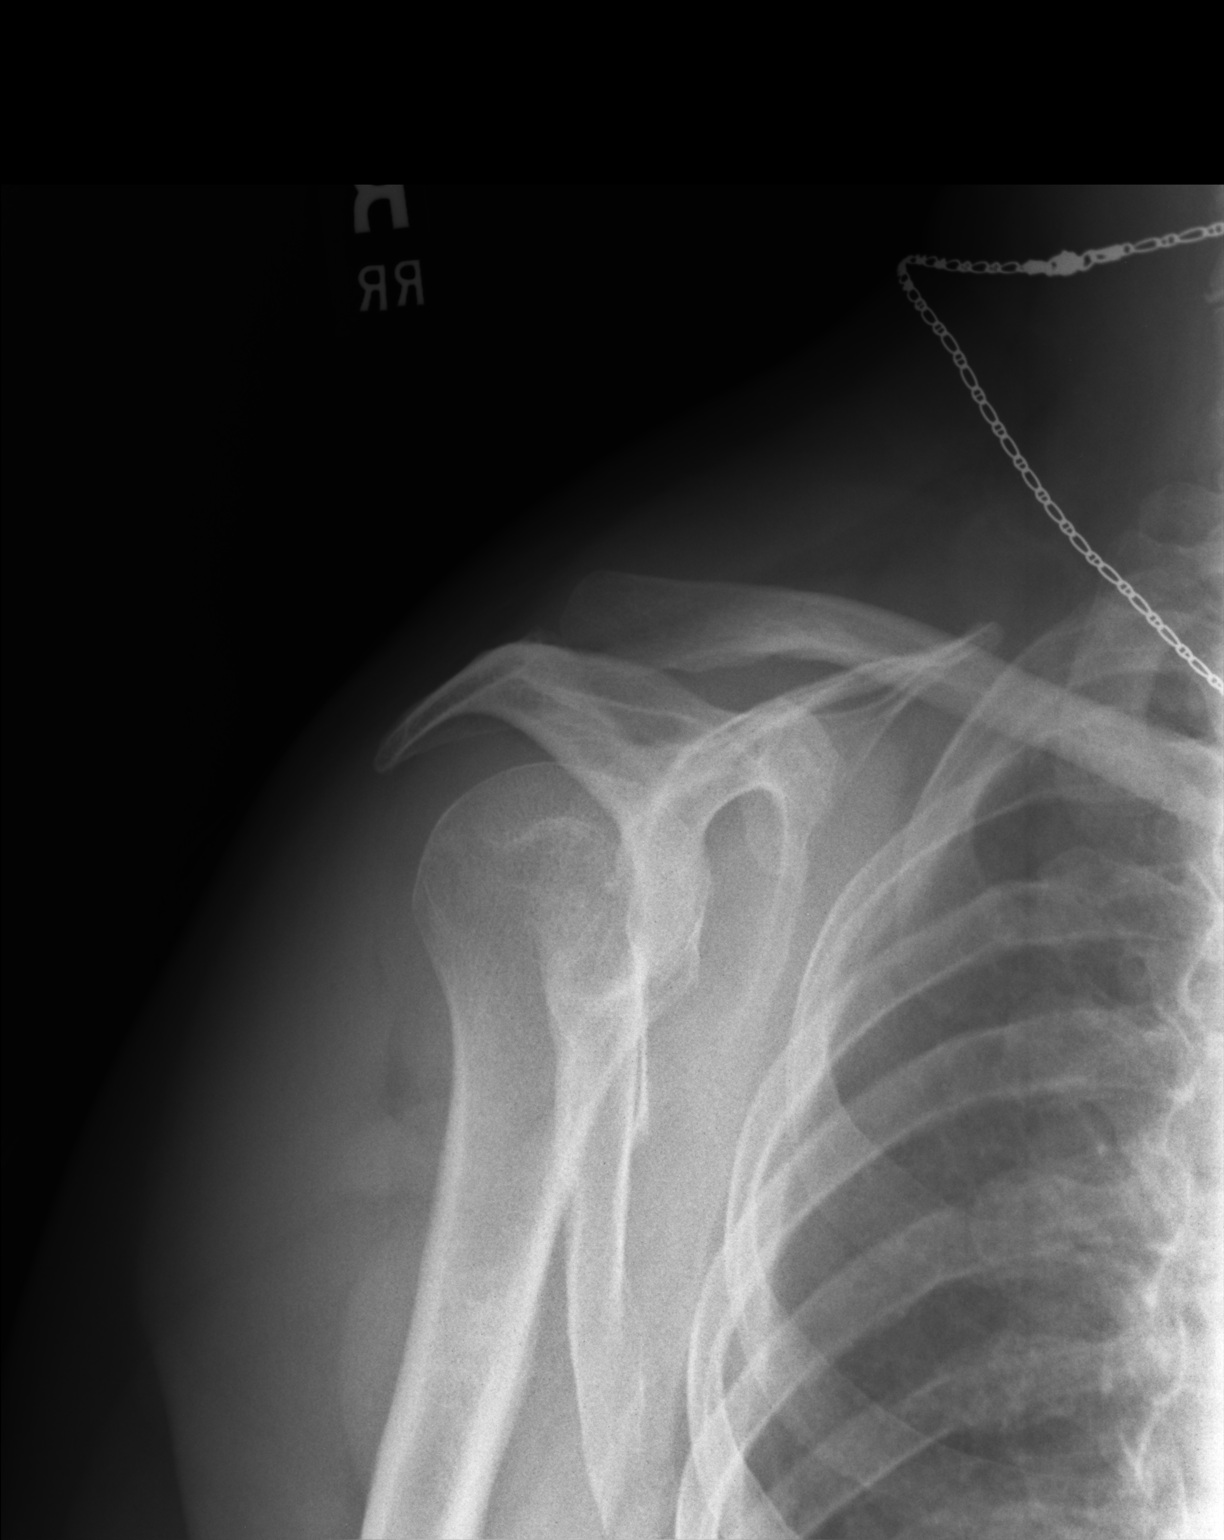

[axial]
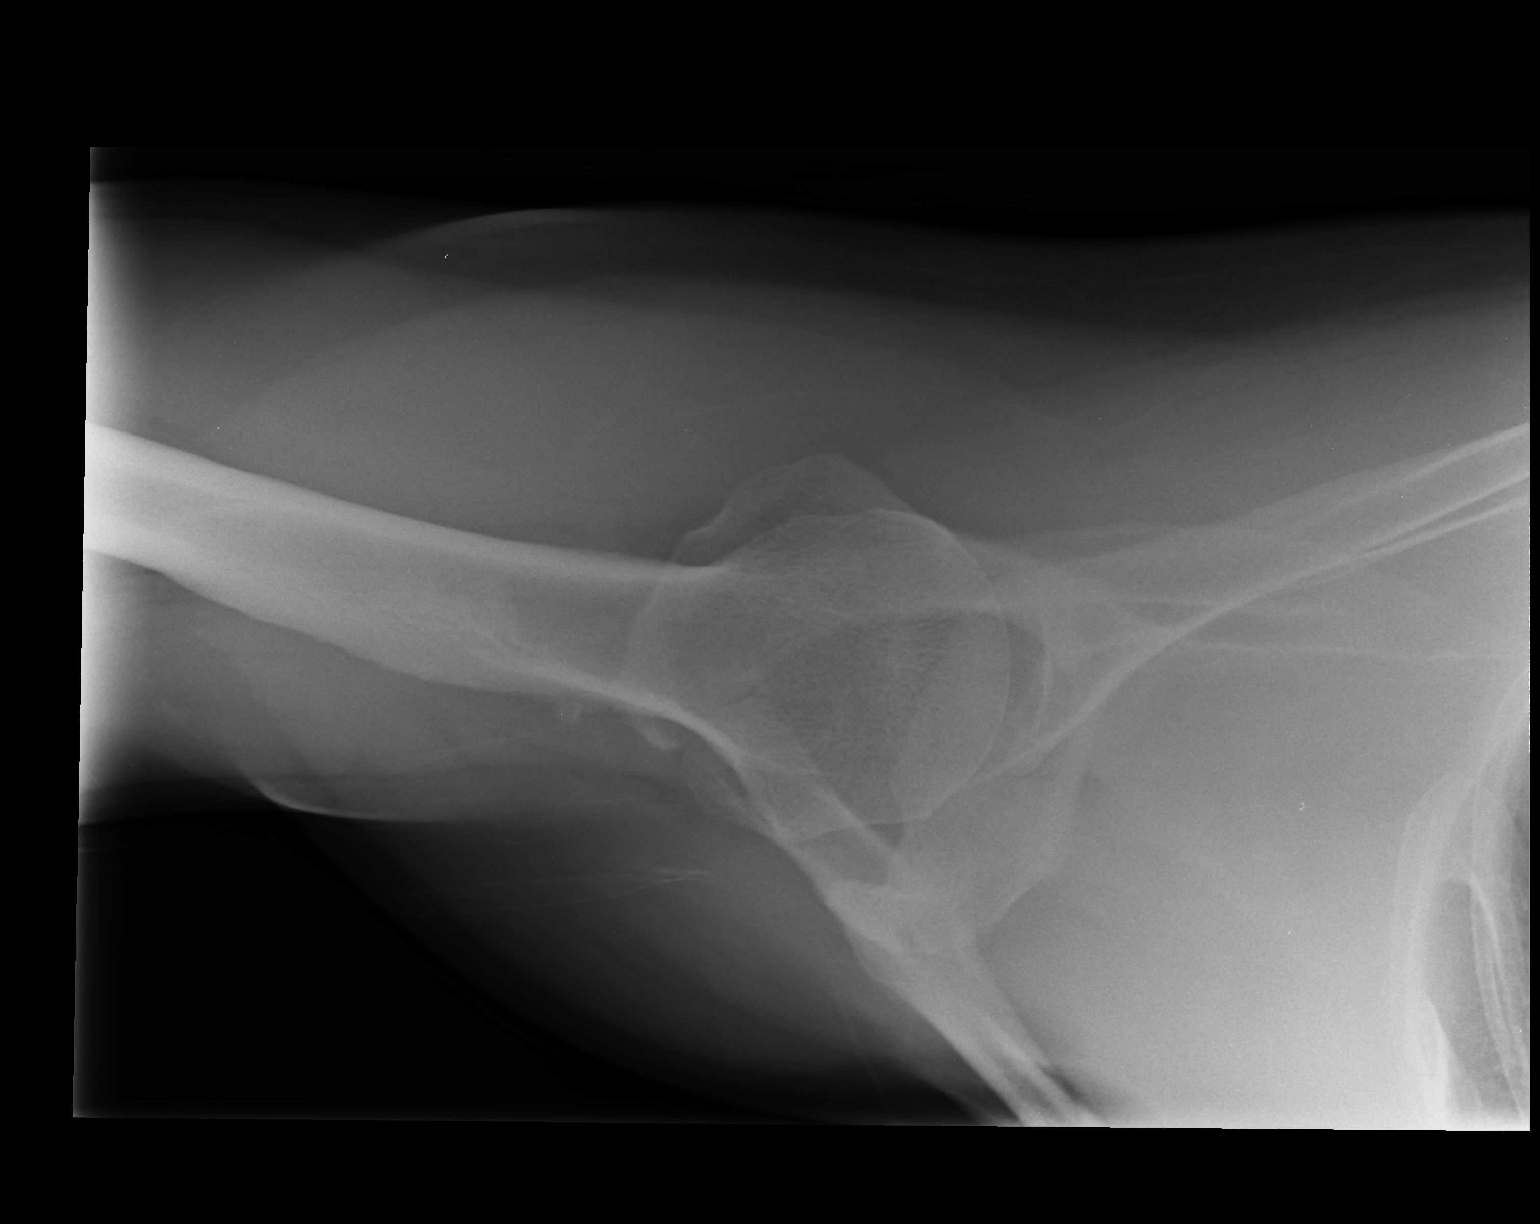

[3 of 3 positions shown; findings below may reference images not displayed]

FINDINGS: There is no fracture or dislocation. There is moderate
osteoarthritis of the inferior aspect of the glenoid. There is a 4
mm loose body in the bicipital tendon sheath. AC joint is normal.
IMPRESSION: No acute abnormalities. Osteoarthritis with loose body in the
bicipital tendon sheath.

## 2016-06-16 ENCOUNTER — Other Ambulatory Visit: Payer: Self-pay | Admitting: Internal Medicine

## 2016-06-16 DIAGNOSIS — K200 Eosinophilic esophagitis: Secondary | ICD-10-CM

## 2016-06-16 DIAGNOSIS — K222 Esophageal obstruction: Principal | ICD-10-CM

## 2016-06-16 DIAGNOSIS — K219 Gastro-esophageal reflux disease without esophagitis: Secondary | ICD-10-CM

## 2017-04-07 ENCOUNTER — Other Ambulatory Visit: Payer: Self-pay | Admitting: Internal Medicine

## 2017-04-07 DIAGNOSIS — K200 Eosinophilic esophagitis: Secondary | ICD-10-CM

## 2017-04-07 DIAGNOSIS — K222 Esophageal obstruction: Principal | ICD-10-CM

## 2017-04-07 DIAGNOSIS — K219 Gastro-esophageal reflux disease without esophagitis: Secondary | ICD-10-CM

## 2017-04-14 ENCOUNTER — Other Ambulatory Visit: Payer: Self-pay | Admitting: Internal Medicine

## 2017-04-14 DIAGNOSIS — K222 Esophageal obstruction: Principal | ICD-10-CM

## 2017-04-14 DIAGNOSIS — K219 Gastro-esophageal reflux disease without esophagitis: Secondary | ICD-10-CM

## 2017-04-14 DIAGNOSIS — K200 Eosinophilic esophagitis: Secondary | ICD-10-CM

## 2017-04-17 ENCOUNTER — Other Ambulatory Visit: Payer: Self-pay

## 2017-04-17 DIAGNOSIS — K200 Eosinophilic esophagitis: Secondary | ICD-10-CM

## 2017-04-17 DIAGNOSIS — K219 Gastro-esophageal reflux disease without esophagitis: Secondary | ICD-10-CM

## 2017-04-17 DIAGNOSIS — K222 Esophageal obstruction: Principal | ICD-10-CM

## 2017-04-17 MED ORDER — PANTOPRAZOLE SODIUM 40 MG PO TBEC: 40.0000 mg | DELAYED_RELEASE_TABLET | Freq: Every day | ORAL | 5 refills | Status: AC

## 2017-04-17 NOTE — Telephone Encounter (Signed)
Pantoprazole refilled as requested. 

## 2019-06-04 ENCOUNTER — Encounter (HOSPITAL_COMMUNITY): Payer: Self-pay | Admitting: *Deleted

## 2019-06-04 ENCOUNTER — Emergency Department (HOSPITAL_COMMUNITY)
Admission: EM | Admit: 2019-06-04 | Discharge: 2019-06-04 | Disposition: A | Discharge status: Home / Self Care | Payer: Worker's Compensation | Attending: Emergency Medicine | Admitting: Emergency Medicine

## 2019-06-04 ENCOUNTER — Other Ambulatory Visit: Payer: Self-pay

## 2019-06-04 DIAGNOSIS — I10 Essential (primary) hypertension: Secondary | ICD-10-CM | POA: Diagnosis not present

## 2019-06-04 DIAGNOSIS — Y990 Civilian activity done for income or pay: Secondary | ICD-10-CM | POA: Insufficient documentation

## 2019-06-04 DIAGNOSIS — Z79899 Other long term (current) drug therapy: Secondary | ICD-10-CM | POA: Insufficient documentation

## 2019-06-04 DIAGNOSIS — Y9389 Activity, other specified: Secondary | ICD-10-CM | POA: Diagnosis not present

## 2019-06-04 DIAGNOSIS — W01198A Fall on same level from slipping, tripping and stumbling with subsequent striking against other object, initial encounter: Secondary | ICD-10-CM | POA: Diagnosis not present

## 2019-06-04 DIAGNOSIS — Y92812 Truck as the place of occurrence of the external cause: Secondary | ICD-10-CM | POA: Insufficient documentation

## 2019-06-04 DIAGNOSIS — Z87891 Personal history of nicotine dependence: Secondary | ICD-10-CM | POA: Insufficient documentation

## 2019-06-04 DIAGNOSIS — S01312A Laceration without foreign body of left ear, initial encounter: Secondary | ICD-10-CM | POA: Diagnosis present

## 2019-06-04 MED ORDER — LIDOCAINE HCL (PF) 1 % IJ SOLN
10.0000 mL | Freq: Once | INTRAMUSCULAR | Status: AC
  Administered 2019-06-04: 10 mL
  Filled 2019-06-04: qty 30

## 2019-06-04 NOTE — ED Provider Notes (Signed)
[facility]-EMERGENCY DEPT Provider Note   CSN: [id number] Arrival date & time: 06/04/19  1547     History   Chief Complaint Chief Complaint  Patient presents with  . Ear Injury    HPI Dominic Jackson is a 53 y.o. male.     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 His hand slipped, and patient fell against the tool box at the back of the truck. He struck his left ear on the tool box, resulting in a laceration to the top of the ear, along with a flap avulsion to the inner aspect of the ear. Bleeding currently controlled. Tetanus is up to date.  The history is provided by the patient. No language interpreter was used.  Facial Injury Mechanism of injury:  Fall Injury location: left ear. Pain details:    Quality:  Dull   Severity:  Mild Foreign body present:  No foreign bodies   Past Medical History:  Diagnosis Date  . Arthritis   . Borderline hypertension   . Eosinophilic esophagitis   . GERD (gastroesophageal reflux disease)   . Hyperlipidemia   . Hypertension     Patient Active Problem List   Diagnosis Date Noted  . Esophageal dysphagia 06/28/2015  . Food impaction of esophagus 06/28/2015  . GERD (gastroesophageal reflux disease) 08/19/2011  . Eosinophilic esophagitis 08/19/2011    Past Surgical History:  Procedure Laterality Date  . ESOPHAGOGASTRODUODENOSCOPY  07/29/2011   Esophagoscopy with removal of food impaction, biopsies suggested eosinophilic esophagitis  . ESOPHAGOGASTRODUODENOSCOPY N/A 06/28/2015   Procedure: ESOPHAGOGASTRODUODENOSCOPY (EGD);  Surgeon: Meryl Dare, MD;  Location: [facility];  Service: Endoscopy;  Laterality: N/A;  . KNEE SURGERY Right         Home Medications    Prior to Admission medications   Medication Sig Start Date End Date Taking? Authorizing Provider  atorvastatin (LIPITOR) 40 MG tablet Take 40 mg by mouth daily.  04/24/15   [provider]  HUMIRA PEN 40 MG/0.8ML PNKT Inject 40 mg into the skin every 14 (fourteen) days. 06/24/15   [provider]  losartan-hydrochlorothiazide (HYZAAR) 50-12.5 MG per tablet Take 1 tablet by mouth daily.    [provider]  meloxicam (MOBIC) 15 MG tablet Take 15 mg by mouth daily.    [provider]  pantoprazole (PROTONIX) 40 MG tablet Take 1 tablet (40 mg total) by mouth daily. 04/17/17   Iva Boop, MD  sildenafil (VIAGRA) 100 MG tablet Take 100 mg by mouth daily as needed for erectile dysfunction.    [provider]  traMADol (ULTRAM) 50 MG tablet Take 50 mg by mouth every 6 (six) hours as needed for pain.    [provider]    Family History Family History  Problem Relation Age of Onset  . Cancer Maternal Grandmother   . Heart disease Maternal Grandmother   . Skin cancer Father   . Diabetes Maternal Aunt   . Colon cancer Neg Hx   . Stomach cancer Neg Hx     Social History Social History   Tobacco Use  . Smoking status: Former Games developer  . Smokeless tobacco: Never Used  Substance Use Topics  . Alcohol use: Yes    Alcohol/week: 7.0 standard drinks    Types: 7 Cans of beer per week    Comment: 1 can of beer a night  . Drug use: No     Allergies  Simvastatin   Review of Systems Review of Systems  Skin: Positive for wound.  All other systems reviewed and are negative.    Physical Exam Updated Vital Signs BP (!) 143/108 (BP Location: Right Arm)   Pulse 80   Temp 98.4 F (36.9 C) (Oral)   Resp 16   Ht 5\' 7"  (1.702 m)   Wt 86.2 kg   SpO2 94%   BMI 29.76 kg/m   Physical Exam Vitals signs and nursing note reviewed.  Constitutional:      Appearance: Normal appearance.  HENT:     Head:      Mouth/Throat:     Mouth: Mucous membranes are moist.  Eyes:     Conjunctiva/sclera: Conjunctivae normal.  Neck:     Musculoskeletal: Normal range of motion and neck supple.   Cardiovascular:     Rate and Rhythm: Normal rate and regular rhythm.  Pulmonary:     Effort: Pulmonary effort is normal.     Breath sounds: Normal breath sounds.  Abdominal:     Palpations: Abdomen is soft.  Musculoskeletal: Normal range of motion.  Skin:    General: Skin is warm and dry.  Neurological:     Mental Status: He is alert and oriented to person, place, and time.  Psychiatric:        Mood and Affect: Mood normal.        ED Treatments / Results  Labs (all labs ordered are listed, but only abnormal results are displayed) Labs Reviewed - No data to display  EKG None  Radiology No results found.  Procedures .Marland Kitchen.Laceration Repair  Date/Time: 06/04/2019 7:40 PM Performed by: Felicie MornSmith, Richardine Peppers, NP Authorized by: Felicie MornSmith, Dallen Bunte, NP   Consent:    Consent obtained:  Verbal   Consent given by:  Patient   Risks discussed:  Infection, need for additional repair and poor cosmetic result Anesthesia (see MAR for exact dosages):    Anesthesia method:  Nerve block   Block location:  Auricular   Block needle gauge:  27 G   Block anesthetic:  Lidocaine 1% w/o epi   Block injection procedure:  Anatomic landmarks identified, introduced needle, incremental injection, anatomic landmarks palpated and negative aspiration for blood   Block outcome:  Anesthesia achieved Laceration details:    Location:  Ear   Ear location:  L ear   Length (cm):  1.5 Repair type:    Repair type:  Simple Pre-procedure details:    Preparation:  Patient was prepped and draped in usual sterile fashion Exploration:    Hemostasis achieved with:  Direct pressure   Wound exploration: entire depth of wound probed and visualized     Contaminated: no   Treatment:    Area cleansed with:  Betadine and saline   Amount of cleaning:  Standard   Irrigation solution:  Sterile saline Skin repair:    Repair method:  Sutures   Suture size:  5-0   Suture material:  Chromic gut   Suture technique:  Simple  interrupted   Number of sutures:  5 Post-procedure details:    Dressing:  Sterile dressing   Patient tolerance of procedure:  Tolerated well, no immediate complications Comments:     Left ear laceration (helix).  Marland Kitchen..Laceration Repair  Date/Time: 06/04/2019 11:48 PM Performed by: Felicie MornSmith, Samani Deal, NP Authorized by: Felicie MornSmith, Jordin Vicencio, NP   Consent:    Consent obtained:  Verbal   Consent given by:  Patient   Risks discussed:  Infection, pain and poor cosmetic result  Anesthesia (see MAR for exact dosages):    Anesthesia method:  Nerve block   Block location:  Auricular   Block needle gauge:  27 G   Block anesthetic:  Lidocaine 1% w/o epi   Block injection procedure:  Anatomic landmarks identified, introduced needle, incremental injection, anatomic landmarks palpated and negative aspiration for blood   Block outcome:  Anesthesia achieved Laceration details:    Location:  Ear   Ear location:  L ear   Length (cm):  1.5 Repair type:    Repair type:  Simple Pre-procedure details:    Preparation:  Patient was prepped and draped in usual sterile fashion Exploration:    Hemostasis achieved with:  Direct pressure   Wound exploration: entire depth of wound probed and visualized     Contaminated: no   Treatment:    Area cleansed with:  Betadine and saline   Amount of cleaning:  Standard   Irrigation solution:  Sterile saline Skin repair:    Repair method:  Sutures   Suture material:  Chromic gut   Suture technique:  Simple interrupted   Number of sutures:  5 Post-procedure details:    Dressing:  Sterile dressing Comments:     Ear laceration (antihelix). Flap avulsion.   (including critical care time)  Medications Ordered in ED Medications  lidocaine (PF) (XYLOCAINE) 1 % injection 10 mL (10 mLs Infiltration Given by Other 06/04/19 1627)     Initial Impression / Assessment and Plan / ED Course  I have reviewed the triage vital signs and the nursing notes.  Pertinent labs & imaging  results that were available during my care of the patient were reviewed by me and considered in my medical decision making (see chart for details).        Tetanus UTD. Laceration occurred < 12 hours prior to repair. Discussed laceration care with pt and answered questions. Absorbable sutures used. Follow-up for wound check should there be signs of  infection. Pt is hemodynamically stable with no complaints prior to dc.    Final Clinical Impressions(s) / ED Diagnoses   Final diagnoses:  Laceration of antihelix of left ear, initial encounter  Laceration of helix of left ear, initial encounter    ED Discharge Orders    None       Etta Quill, NP 06/04/19 2354    Lacretia Leigh, MD 06/07/19 [phone]

## 2019-06-04 NOTE — ED Triage Notes (Signed)
Pt cut his left ear on the toolbox edge.  Bleeding controlled in triage.

## 2019-06-04 NOTE — ED Triage Notes (Signed)
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

## 2019-06-19 DIAGNOSIS — I10 Essential (primary) hypertension: Secondary | ICD-10-CM | POA: Diagnosis not present

## 2019-06-19 DIAGNOSIS — M069 Rheumatoid arthritis, unspecified: Secondary | ICD-10-CM | POA: Diagnosis not present

## 2019-06-19 DIAGNOSIS — K219 Gastro-esophageal reflux disease without esophagitis: Secondary | ICD-10-CM | POA: Diagnosis not present

## 2019-06-19 DIAGNOSIS — E785 Hyperlipidemia, unspecified: Secondary | ICD-10-CM | POA: Diagnosis not present

## 2021-06-11 ENCOUNTER — Other Ambulatory Visit: Payer: Self-pay

## 2021-06-11 ENCOUNTER — Encounter: Payer: Self-pay | Admitting: Internal Medicine

## 2021-06-11 ENCOUNTER — Ambulatory Visit (INDEPENDENT_AMBULATORY_CARE_PROVIDER_SITE_OTHER): Payer: No Typology Code available for payment source | Admitting: Internal Medicine

## 2021-06-11 VITALS — BP 136/86 | HR 60 | Temp 98.4°F | Ht 67.0 in | Wt 196.1 lb

## 2021-06-11 DIAGNOSIS — Z23 Encounter for immunization: Secondary | ICD-10-CM | POA: Diagnosis not present

## 2021-06-11 DIAGNOSIS — M0510 Rheumatoid lung disease with rheumatoid arthritis of unspecified site: Secondary | ICD-10-CM

## 2021-06-11 MED ORDER — FAMOTIDINE 20 MG PO TABS: ORAL_TABLET | ORAL | 11 refills | Status: AC

## 2021-06-11 NOTE — Patient Instructions (Addendum)
You don't have significant copd or the type of pulmonary fibrosis that is the bad type  You appear to RA in your lungs but it is relatively mild  Make sure you check your oxygen saturation at your highest level of activity to be sure it stays over 90% and keep track of it at least once a week, more often if breathing getting worse, and let me know if losing ground.   We need the actual PFTs that were done - bring them with you   Continue Pantoprazole (protonix) 40 mg   Take  30-60 min before first meal of the day and Pepcid (famotidine)  20 mg after supper until return to office - this is the best way to tell whether stomach acid is contributing to your problem.    GERD (REFLUX)  is an extremely common cause of respiratory symptoms just like yours , many times with no obvious heartburn at all.    It can be treated with medication, but also with lifestyle changes including elevation of the head of your bed (ideally with 6-8inch blocks under the headboard of your bed),  Smoking cessation, avoidance of late meals, excessive alcohol, and avoid fatty foods, chocolate, peppermint, colas, red wine, and acidic juices such as orange juice.  NO MINT OR MENTHOL PRODUCTS SO NO COUGH DROPS  USE SUGARLESS CANDY INSTEAD (Jolley ranchers or Stover's or Life Savers) or even ice chips will also do - the key is to swallow to prevent all throat clearing. NO OIL BASED VITAMINS - use powdered substitutes.  Avoid fish oil when coughing.   Please schedule a follow up visit in 6  months but call sooner if needed

## 2021-06-11 NOTE — Progress Notes (Signed)
Dominic Jackson, male    DOB: 11/16/1965,    MRN: [id number]   Brief patient profile:  55 yowm   quit smoking 2005 ? 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 ILD.   RA dx 2017 on humira per [name] since then- was poorly controlled R Knee pain so increased humira 2022. Gained 10 lb since RA   History of Present Illness  06/11/2021  Pulmonary/ 1st office eval/ [name] / [place] [facility]  Chief Complaint  Patient presents with   Consult    [name]   Patient would like flu shot today.   Dyspnea:  walking across a parking lot / no problem shopping   Maybe 500 ft and has to stop at slower pace than others Cough: when breath  in with cold exp but also p shopping  Wheezing when supine Sleep: flat bed / 2-3 pillows SABA use: never   No obvious day to day or daytime variability or assoc excess/ purulent sputum or mucus plugs or hemoptysis or cp or chest tightness, subjective wheeze or overt sinus or hb symptoms.   sleep without nocturnal  or early am exacerbation  of respiratory  c/o's or need for noct saba. Also denies any obvious fluctuation of symptoms with weather or environmental changes or other aggravating or alleviating factors except as outlined above   No unusual exposure hx or h/o childhood pna/ asthma or knowledge of premature birth.  Current Allergies, Complete Past Medical History, Past Surgical History, Family History, and Social History were reviewed in [employer] record.  ROS  The following are not active complaints unless bolded Hoarseness, sore throat, dysphagia, dental problems, itching, sneezing,  nasal congestion or discharge of excess mucus or purulent secretions, ear ache,   fever, chills, sweats, unintended wt loss or wt gain, classically pleuritic or exertional cp,  orthopnea  pnd or arm/hand swelling  or leg swelling, presyncope, palpitations, abdominal pain, anorexia, nausea, vomiting, diarrhea  or change in bowel habits or change in bladder habits, change in stools or change in urine, dysuria, hematuria,  rash, arthralgias, visual complaints, headache, numbness, weakness or ataxia or problems with walking or coordination,  change in mood or  memory.           Past Medical History:  Diagnosis Date   Arthritis    Borderline hypertension    Eosinophilic esophagitis    GERD (gastroesophageal reflux disease)    Hyperlipidemia    Hypertension     Outpatient Medications Prior to Visit  Medication Sig Dispense Refill   atorvastatin (LIPITOR) 80 MG tablet Take 80 mg by mouth daily.     CALCIUM PO Take 400 mg by mouth.     gabapentin (NEURONTIN) 300 MG capsule Take 300 mg by mouth 3 (three) times daily.     HUMIRA PEN 40 MG/0.8ML PNKT Inject 40 mg into the skin every 14 (fourteen) days.     leflunomide (ARAVA) 10 MG tablet Take 10 mg by mouth daily.     losartan-hydrochlorothiazide (HYZAAR) 50-12.5 MG per tablet Take 1 tablet by mouth daily.     meloxicam (MOBIC) 15 MG tablet Take 15 mg by mouth daily.     pantoprazole (PROTONIX) 40 MG tablet Take 1 tablet (40 mg total) by mouth daily. 30 tablet 5   sertraline (ZOLOFT) 100  MG tablet Take 100 mg by mouth daily.     atorvastatin (LIPITOR) 40 MG tablet Take 80 mg by mouth daily.     sildenafil (VIAGRA) 100 MG tablet Take 100 mg by mouth daily as needed for erectile dysfunction.     traMADol (ULTRAM) 50 MG tablet Take 50 mg by mouth every 6 (six) hours as needed for pain.     No facility-administered medications prior to visit.     Objective:     BP 136/86 (BP Location: Left Arm, Patient Position: Sitting)   Pulse 60   Temp 98.4 F (36.9 C)   Ht 5\' 7"  (1.702 m)   Wt 196 lb 1.9 oz (89 kg)   SpO2 100% Comment: ra  BMI 30.72 kg/m   SpO2: 100 % (ra)  Pleasant amb wm nad   HEENT : pt wearing mask not  removed for exam due to covid -19 concerns.    NECK :  without JVD/Nodes/TM/ nl carotid upstrokes bilaterally   LUNGS: no acc muscle use,  Nl contour chest which is clear to A and P bilaterally without cough on insp or exp maneuvers   CV:  RRR  no s3 or murmur or increase in P2, and no edema   ABD:  soft and nontender with nl inspiratory excursion in the supine position. No bruits or organomegaly appreciated, bowel sounds nl  MS:  Nl gait/ ext warm without deformities, calf tenderness, cyanosis or clubbing No obvious joint restrictions   SKIN: warm and dry without lesions    NEURO:  alert, approp, nl sensorium with  no motor or cerebellar deficits apparent.         Assessment   Rheumatoid lung disease (HCC) RA dx 2017/ [facility] - ? Sept 2022 PFTs mild restriction and imparied DLCO" on report s actual numbers/ requested  - CT [facility] Not UIP  Mild bronchiectasis,faint wedge shaped GG changes upper lobes, very minimal fibrosis s honeycombing  - 06/11/2021   Walked on RA x  3  lap(s) =  approx 450 ft @ fast pace, stopped due to end of study with mild sob on 1st lap with lowest 02 sats 96%    DDx for pulmonary fibrosis  includes idiopathic pulmonary fibrosis, pulmonary fibrosis associated with rheumatologic diseases (which have a relatively benign course in most cases and appears most lielky here) , adverse effect from  drugs such as chemotherapy or amiodarone exposure, nonspecific interstitial pneumonia which is typically steroid responsive, and chronic hypersensitivity pneumonitis.   In active  smokers Langerhan's Cell  Histiocyctosis (eosinophilic granuomatosis),  DIP,  and Respiratory Bronchiolitis ILD also need to be considered,  But very unlikely here.  Key is controlling the underlying dz (RA) and avoiding meds which may contribute to PF (eg MTX)  Also: Use of PPI is associated with improved survival time and with decreased radiologic fibrosis per  King's study published in AJRCCM vol 184 p1390.  Dec 2011 and also may have other beneficial effects as per the latest review in [place] vol 193 p1345 Jun 20016.  This may not always be cause and effect, but given how underwhelming  (at least in terms of short term benefit)   and expensive  all the other  Drugs developed to day  have been for pf,   rec start  rx ppi / diet/ lifestyle modification and f/u with serial walking sats and lung volumes for now to put more points on the curve /  establish firm baseline before considering additional measures.   rec 1) Make sure you check your oxygen saturation at your highest level of activity to be sure it stays over 90% and keep track of it at least once a week, more often if breathing getting worse, and let me know if losing ground.   2) Gerd rx and diet   F/u in 6 m with pfts    Each maintenance medication was reviewed in detail including emphasizing most importantly the difference between maintenance and prns and under what circumstances the prns are to be triggered using an action plan format where appropriate.  Total time for H and P, chart review, counseling,  directly observing portions of ambulatory 02 saturation study/ and generating customized AVS unique to this office visit / same day charting  > 60 min                     Christinia Gully, MD 06/11/2021

## 2021-06-12 ENCOUNTER — Encounter: Payer: Self-pay | Admitting: Internal Medicine

## 2021-06-12 NOTE — Assessment & Plan Note (Addendum)
RA dx 2017/ [facility] - ? Sept 2022 PFTs mild restriction and imparied DLCO" on report s actual numbers/ requested  - CT chest 04/14/21 at [facility] Not UIP  Mild bronchiectasis,faint wedge shaped GG changes upper lobes, very minimal fibrosis s honeycombing  - 06/11/2021   Walked on RA x  3  lap(s) =  approx 450 ft @ fast pace, stopped due to end of study with mild sob on 1st lap with lowest 02 sats 96%    DDx for pulmonary fibrosis  includes idiopathic pulmonary fibrosis, pulmonary fibrosis associated with rheumatologic diseases (which have a relatively benign course in most cases and appears most lielky here) , adverse effect from  drugs such as chemotherapy or amiodarone exposure, nonspecific interstitial pneumonia which is typically steroid responsive, and chronic hypersensitivity pneumonitis.   In active  smokers Langerhan's Cell  Histiocyctosis (eosinophilic granuomatosis),  DIP,  and Respiratory Bronchiolitis ILD also need to be considered,  But very unlikely here.  Key is controlling the underlying dz (RA) and avoiding meds which may contribute to PF (eg MTX)  Also: Use of PPI is associated with improved survival time and with decreased radiologic fibrosis per King's study published in AJRCCM vol 184 p1390.  Dec 2011 and also may have other beneficial effects as per the latest review in [place] vol 193 p1345 Jun 20016.  This may not always be cause and effect, but given how underwhelming  (at least in terms of short term benefit)   and expensive  all the other  Drugs developed to day  have been for pf,   rec start  rx ppi / diet/ lifestyle modification and f/u with serial walking sats and lung volumes for now to put more points on the curve / establish firm baseline before considering additional measures.   rec 1) Make sure you check your oxygen saturation at your highest level of activity to be sure it stays over 90% and keep track of it at least once a week, more often if breathing  getting worse, and let me know if losing ground.   2) Gerd rx and diet   F/u in 6 m with pfts    Each maintenance medication was reviewed in detail including emphasizing most importantly the difference between maintenance and prns and under what circumstances the prns are to be triggered using an action plan format where appropriate.  Total time for H and P, chart review, counseling,  directly observing portions of ambulatory 02 saturation study/ and generating customized AVS unique to this office visit / same day charting  > 60 min

## 2021-06-22 ENCOUNTER — Telehealth: Payer: Self-pay

## 2021-06-22 NOTE — Telephone Encounter (Signed)
ATC patient to notify of typo. Left detailed VM on patients cell phone (ok per DPR) clarifying typo. Advised patient to call back for questions Nothing further needed.

## 2021-06-22 NOTE — Telephone Encounter (Signed)
-----   Message from Nyoka Cowden, MD sent at 06/12/2021  5:34 AM EST ----- Let pt know there was a typo on his instruction missing the word "don't" as in "you don't have copd or the bad type of pulmonary fibrosis"

## 2021-08-12 ENCOUNTER — Telehealth: Payer: Self-pay | Admitting: Internal Medicine

## 2021-08-12 NOTE — Telephone Encounter (Signed)
PFT report given to MW for review

## 2021-09-03 LAB — PULMONARY FUNCTION TEST ARMC ONLY
FEV1/FVC: 76 %
FEV1: 3.17 L
FVC: 4.14 L

## 2021-12-09 ENCOUNTER — Encounter: Payer: Self-pay | Admitting: Internal Medicine

## 2021-12-09 ENCOUNTER — Ambulatory Visit (INDEPENDENT_AMBULATORY_CARE_PROVIDER_SITE_OTHER): Payer: No Typology Code available for payment source | Admitting: Internal Medicine

## 2021-12-09 VITALS — BP 120/78 | HR 68 | Temp 98.4°F | Ht 67.0 in | Wt 194.6 lb

## 2021-12-09 DIAGNOSIS — M0510 Rheumatoid lung disease with rheumatoid arthritis of unspecified site: Secondary | ICD-10-CM | POA: Diagnosis not present

## 2021-12-09 MED ORDER — BREZTRI AEROSPHERE 160-9-4.8 MCG/ACT IN AERO: 2.0000 | INHALATION_SPRAY | Freq: Two times a day (BID) | RESPIRATORY_TRACT | 0 refills | Status: DC

## 2021-12-09 NOTE — Patient Instructions (Signed)
Try breztri  1-2 pffs every 12 hours as needed to see if breathing/ wheezing improve and if so I can call you in [facility] thru the [place]  Work on inhaler technique:  relax and gently blow all the way out then take a nice smooth full deep breath back in, triggering the inhaler at same time you start breathing in.  Hold for up to 5 seconds if you can. Blow out thru nose. Rinse and gargle with water when done.  If mouth or throat bother you at all,  try brushing teeth/gums/tongue with arm and hammer toothpaste/ make a slurry and gargle and spit out.       Return to the [place] office next available for PFTs and visit with me on the same day but don't use your breztri that day

## 2021-12-09 NOTE — Progress Notes (Unsigned)
Dominic Jackson, male    DOB: 12/06/1965,    MRN: [id number]   Brief patient profile:  [id number]   quit smoking 2005 ? 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 ILD.   RA dx 2017 on humira per [name] since then- was poorly controlled R Knee pain so increased humira 2022. Gained 10 lb since RA   History of Present Illness  06/11/2021  Pulmonary/ 1st office eval/ [name] / [place] [facility]  Chief Complaint  Patient presents with   Consult    [name]   Patient would like flu shot today.   Dyspnea:  walking across a parking lot / no problem shopping   Maybe 500 ft and has to stop at slower pace than others Cough: when breath  in with cold exp but also p shopping  Wheezing when supine Sleep: flat bed / 2-3 pillows SABA use: never  Rec You don't have significant copd or the type of pulmonary fibrosis that is the bad type You appear to RA in your lungs but it is relatively mild Make sure you check your oxygen saturation at your highest level of activity to be sure it stays over 90% and keep track of it at least once a week, more often if breathing getting worse, and let me know if losing ground.  We need the actual PFTs that were done - bring them with you  Continue Pantoprazole (protonix) 40 mg   Take  30-60 min before first meal of the day and Pepcid (famotidine)  20 mg after supper until return to office   GERD diet .      12/09/2021  f/u ov/[name] office/[name] re: ? RA lung dz  maint on no rx  Chief Complaint  Patient presents with   Follow-up    Has a little bit of wheezing since last ov.   Dyspnea:  walking uphill about the same = MMRC1 = can walk nl pace, flat grade, can't hurry or go uphills or steps s sob   Cough: has with pollen every year/ uses flonase ' thinks maybe making him wheeze also this year   Sleeping: no resp cc but gf says wheezing at hs/ bed is flat on side / 2 pillows  SABA use: none  02: none  Covid status: never vax / Jan 2023 infected     No obvious day to day or daytime variability or assoc excess/ purulent sputum or mucus plugs or hemoptysis or cp or chest tightness, or overt sinus or hb symptoms.    Also denies any obvious fluctuation of symptoms with weather or environmental changes or other aggravating or alleviating factors except as outlined above   No unusual exposure hx or h/o childhood pna/ asthma or knowledge of premature birth.  Current Allergies, Complete Past Medical History, Past Surgical History, Family History, and Social History were reviewed in [employer] record.  ROS  The following are not active complaints unless bolded Hoarseness, sore throat, dysphagia, dental problems, itching, sneezing,  nasal congestion or discharge of excess mucus or purulent secretions, ear ache,   fever, chills, sweats, unintended wt loss or wt gain, classically pleuritic or exertional cp,  orthopnea pnd or arm/hand swelling  or leg swelling, presyncope, palpitations, abdominal pain, anorexia, nausea, vomiting, diarrhea  or change in bowel habits or  change in bladder habits, change in stools or change in urine, dysuria, hematuria,  rash, arthralgias better p new increas humira since 1st of year , visual complaints, headache, numbness, weakness or ataxia or problems with walking or coordination,  change in mood or  memory.        Current Meds  Medication Sig   acetaminophen (TYLENOL) 500 MG tablet TAKE ONE TABLET BY MOUTH THREE TIMES A DAY AS NEEDED FOR BACK PAIN   atorvastatin (LIPITOR) 80 MG tablet Take 80 mg by mouth daily.   CALCIUM PO Take 400 mg by mouth.   famotidine (PEPCID) 20 MG tablet One after supper   gabapentin (NEURONTIN) 300 MG capsule Take 300 mg by mouth 3 (three) times daily.   HUMIRA PEN 40 MG/0.8ML PNKT Inject 40 mg into the skin  every 14 (fourteen) days.   losartan-hydrochlorothiazide (HYZAAR) 50-12.5 MG per tablet Take 1 tablet by mouth daily.   pantoprazole (PROTONIX) 40 MG tablet Take 1 tablet (40 mg total) by mouth daily.   sertraline (ZOLOFT) 100 MG tablet Take 100 mg by mouth daily.          Past Medical History:  Diagnosis Date   Arthritis    Borderline hypertension    Eosinophilic esophagitis    GERD (gastroesophageal reflux disease)    Hyperlipidemia    Hypertension         Objective:     Wt Readings from Last 3 Encounters:  12/09/21 194 lb 9.6 oz (88.3 kg)  06/11/21 196 lb 1.9 oz (89 kg)  06/04/19 190 lb (86.2 kg)      Vital signs reviewed  12/09/2021  - Note at rest 02 sats  96% on RA   General appearance:    amb pleasant wm nad   HEENT : Oropharynx  clear  Nasal turbintes nl    NECK :  without  appent JVD/ palpable Nodes/TM    LUNGS: no acc muscle use,  Nl contour chest which is clear to A and P bilaterally without cough on insp or exp maneuvers   CV:  RRR  no s3 or murmur or increase in P2, and no edema   ABD:  soft and nontender with nl inspiratory excursion in the supine position. No bruits or organomegaly appreciated   MS:  walks with def limp R hip ext warm without deformities Or obvious joint restrictions  calf tenderness, cyanosis or clubbing     SKIN: warm and dry without lesions    NEURO:  alert, approp, nl sensorium with  no motor or cerebellar deficits apparent.          Assessment

## 2021-12-10 ENCOUNTER — Encounter: Payer: Self-pay | Admitting: Internal Medicine

## 2021-12-10 NOTE — Assessment & Plan Note (Addendum)
RA dx 2017/ [facility] - Sept 6 2022 PFTs mild restriction and imparied DLCO" on report   FVC  3.33 or 80.4% predicted , nl f/v curve  - CT chest [facility]/14/21 [facility] Not UIP  Mild bronchiectasis,faint wedge shaped GG changes upper lobes, very minimal fibrosis s honeycombing   - 06/11/2021   Walked on RA x  3  lap(s) =  approx 450 ft @ fast pace, stopped due to end of study with mild sob on 1st lap with lowest 02 sats 96%   - 12/09/2021   Walked on RA  x  3  lap(s) =  approx 450  ft  @ mod pace, stopped due to end of study with lowest 02 sats 91%  And sob on last lap  - The proper method of use, as well as anticipated side effects, of a metered-dose inhaler were discussed and demonstrated to the patient using teach back method. 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   Discussed in detail all the  indications, usual  risks and alternatives  relative to the benefits with patient who agrees to proceed with w/u as outlined.     Each maintenance medication was reviewed in detail including emphasizing most importantly the difference between maintenance and prns and under what circumstances the prns are to be triggered using an action plan format where appropriate.  Total time for H and P, chart review, counseling, reviewing hfa device(s) , directly observing portions of ambulatory 02 saturation study/ and generating customized AVS unique to this office visit / same day charting  > 30 min

## 2021-12-15 ENCOUNTER — Encounter: Payer: Self-pay | Admitting: Internal Medicine

## 2021-12-15 NOTE — Telephone Encounter (Signed)
Breztri isn't covered by va so next best option is symbicort 160  Take 2 puffs first thing in am and then another 2 puffs about 12 hours later rx x one year supply

## 2021-12-15 NOTE — Telephone Encounter (Signed)
Dr. [name], please advise if is should Breztri we send to the pharmacy for the pt?     Last OV instructions: "Try breztri  1-2 pffs every 12 hours as needed to see if breathing/ wheezing improve and if so I can call you in Symbicort 160 thru the [name]"

## 2021-12-16 MED ORDER — BUDESONIDE-FORMOTEROL FUMARATE 160-4.5 MCG/ACT IN AERO: 2.0000 | INHALATION_SPRAY | Freq: Two times a day (BID) | RESPIRATORY_TRACT | 6 refills | Status: DC

## 2021-12-20 ENCOUNTER — Telehealth: Payer: Self-pay | Admitting: Pharmacy Technician

## 2021-12-21 NOTE — Telephone Encounter (Signed)
  PA request received from [name] that preferred alternative is WIXELA. Please provided justification why patient can not use Wixela (contraindication to the formulary agent, adverse reactions to the formulary agent, therapeutic failure of all formulary alternatives, no formulary alternative exists, or a serious risk is associated with a change to a formulary agent). If a listed reason can not be given please send in new script for Wixela.     Routing to Dr. [name] for him to review one he returns back to the office.

## 2021-12-22 NOTE — Telephone Encounter (Signed)
Serious risk if use wixella as  pt was trained/ mastered HFA and not DPI inhalers

## 2021-12-23 NOTE — Telephone Encounter (Signed)
Submitted info to [employer]. Awaiting response.

## 2021-12-27 MED ORDER — BUDESONIDE-FORMOTEROL FUMARATE 160-4.5 MCG/ACT IN AERO: 2.0000 | INHALATION_SPRAY | Freq: Two times a day (BID) | RESPIRATORY_TRACT | 6 refills | Status: DC

## 2021-12-27 NOTE — Addendum Note (Signed)
Addended by: Carleene Mains D on: 12/27/2021 03:00 PM   Modules accepted: Orders

## 2021-12-27 NOTE — Addendum Note (Signed)
Addended by: Carleene Mains D on: 12/27/2021 02:28 PM   Modules accepted: Orders

## 2021-12-27 NOTE — Telephone Encounter (Signed)
Mychart message sent by pt stating that the [facility] hasn't received the Symbicort Rx that was sent in. Routing this to Meagan who is working at [employer] with Dr. [name] so that way she can print the Rx off for Dr. [name] to sign and then get it faxed to [facility] for pt.

## 2021-12-27 NOTE — Addendum Note (Signed)
Addended by: Wyvonne Lenz on: 12/27/2021 10:57 AM   Modules accepted: Orders

## 2022-02-15 ENCOUNTER — Ambulatory Visit (INDEPENDENT_AMBULATORY_CARE_PROVIDER_SITE_OTHER): Payer: No Typology Code available for payment source | Admitting: Internal Medicine

## 2022-02-15 ENCOUNTER — Encounter: Payer: Self-pay | Admitting: Internal Medicine

## 2022-02-15 DIAGNOSIS — M0510 Rheumatoid lung disease with rheumatoid arthritis of unspecified site: Secondary | ICD-10-CM

## 2022-02-15 LAB — PULMONARY FUNCTION TEST
DL/VA % pred: 89 %
DL/VA: 3.88 ml/min/mmHg/L
DLCO cor % pred: 63 %
DLCO cor: 16.3 ml/min/mmHg
DLCO unc % pred: 63 %
DLCO unc: 16.3 ml/min/mmHg
FEF 25-75 Post: 3.77 L/sec
FEF 25-75 Pre: 2.36 L/sec
FEF2575-%Change-Post: 59 %
FEF2575-%Pred-Post: 132 %
FEF2575-%Pred-Pre: 82 %
FEV1-%Change-Post: 11 %
FEV1-%Pred-Post: 76 %
FEV1-%Pred-Pre: 68 %
FEV1-Post: 2.55 L
FEV1-Pre: 2.28 L
FEV1FVC-%Change-Post: 2 %
FEV1FVC-%Pred-Pre: 108 %
FEV6-%Change-Post: 9 %
FEV6-%Pred-Post: 71 %
FEV6-%Pred-Pre: 65 %
FEV6-Post: 3 L
FEV6-Pre: 2.75 L
FEV6FVC-%Pred-Post: 104 %
FEV6FVC-%Pred-Pre: 104 %
FVC-%Change-Post: 9 %
FVC-%Pred-Post: 68 %
FVC-%Pred-Pre: 62 %
FVC-Post: 3 L
FVC-Pre: 2.75 L
Post FEV1/FVC ratio: 85 %
Post FEV6/FVC ratio: 100 %
Pre FEV1/FVC ratio: 83 %
Pre FEV6/FVC Ratio: 100 %
RV % pred: 81 %
RV: 1.65 L
TLC % pred: 70 %
TLC: 4.48 L

## 2022-02-15 NOTE — Patient Instructions (Addendum)
Plan A = Automatic = Always=   Symbicort 160 Take 2 puffs first thing in am and then another 2 puffs about 12 hours later.    Please schedule a follow up visit in 3 months  [place]  but call sooner if needed  with all medications /inhalers/ solutions in hand so we can verify exactly what you are taking. This includes all medications from all doctors and over the counters

## 2022-02-15 NOTE — Patient Instructions (Signed)
Full PFT performed today. °

## 2022-02-15 NOTE — Progress Notes (Unsigned)
Dominic Jackson, male    DOB: 1965/11/15,    MRN: [id number]   Brief patient profile:  56yowm   quit smoking 2005 ? 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 ILD.   RA dx 2017 on humira per [name] since then- was poorly controlled R Knee pain so increased humira 2022. Gained 10 lb since RA   History of Present Illness  06/11/2021  Pulmonary/ 1st office eval/ [name] / [place] [facility]  Chief Complaint  Patient presents with   Consult    [name]D   Patient would like flu shot today.   Dyspnea:  walking across a parking lot / no problem shopping   Maybe 500 ft and has to stop at slower pace than others Cough: when breath  in with cold exp but also p shopping  Wheezing when supine Sleep: flat bed / 2-3 pillows SABA use: never  Rec You don't have significant copd or the type of pulmonary fibrosis that is the bad type You appear to RA in your lungs but it is relatively mild Make sure you check your oxygen saturation at your highest level of activity to be sure it stays over 90% and keep track of it at least once a week, more often if breathing getting worse, and let me know if losing ground.  We need the actual PFTs that were done - bring them with you  Continue Pantoprazole (protonix) 40 mg   Take  30-60 min before first meal of the day and Pepcid (famotidine)  20 mg after supper until return to office   GERD diet .      12/09/2021  f/u ov/[place] office/Hadleigh Felber re: ? RA lung dz  maint on no rx  Chief Complaint  Patient presents with   Follow-up    Has a little bit of wheezing since last ov.   Dyspnea:  walking uphill about the same = MMRC1 = can walk nl pace, flat grade, can't hurry or go uphills or steps s sob   Cough: has with pollen every year/ uses flonase ' thinks maybe making him wheeze also this year   Sleeping: no resp cc but gf says wheezing at hs/ bed is flat on side / 2 pillows  SABA use: none  02: none  Covid status: never vax / Jan 2023 infected Rec Try breztri  1-2 pffs every 12 hours as needed to see if breathing/ wheezing improve and if so I can call you in Symbicort 160 thru the [name] Work on inhaler technique:    Return to the [name] next available for PFTs and visit with me on the same day but don't use your breztri that day   02/15/2022  f/u ov/Ashantia Amaral re: AB / RA   maint on symb 160 2bid  none on ay of ov with main finding ERV 18% at wt 200  Chief Complaint  Patient presents with   Follow-up    No new issues and PFT results  Dyspnea: MMRC1 = can walk nl pace, flat grade, can't hurry or go uphills or steps s sob   Cough: some sense of pnds esp in Sleeping: does fine on side,no noct  SABA use: none  02: none  Covid status:   never vax/ one infection Jan 2023    No obvious day to  day or daytime variability or assoc excess/ purulent sputum or mucus plugs or hemoptysis or cp or chest tightness, subjective wheeze or overt sinus or hb symptoms.   Sleeping  without nocturnal  or early am exacerbation  of respiratory  c/o's or need for noct saba. Also denies any obvious fluctuation of symptoms with weather or environmental changes or other aggravating or alleviating factors except as outlined above   No unusual exposure hx or h/o childhood pna/ asthma or knowledge of premature birth.  Current Allergies, Complete Past Medical History, Past Surgical History, Family History, and Social History were reviewed in [employer] record.  ROS  The following are not active complaints unless bolded Hoarseness, sore throat, dysphagia, dental problems, itching, sneezing,  nasal congestion or discharge of excess mucus or purulent secretions, ear ache,   fever, chills, sweats, unintended wt loss or wt gain, classically pleuritic or exertional cp,  orthopnea pnd or arm/hand  swelling  or leg swelling, presyncope, palpitations, abdominal pain, anorexia, nausea, vomiting, diarrhea  or change in bowel habits or change in bladder habits, change in stools or change in urine, dysuria, hematuria,  rash, arthralgias better on humira, visual complaints, headache, numbness, weakness or ataxia or problems with walking or coordination,  change in mood or  memory.        Current Meds  Medication Sig   acetaminophen (TYLENOL) 500 MG tablet TAKE ONE TABLET BY MOUTH THREE TIMES A DAY AS NEEDED FOR BACK PAIN   atorvastatin (LIPITOR) 80 MG tablet Take 80 mg by mouth daily.   budesonide-formoterol (SYMBICORT) 160-4.5 MCG/ACT inhaler Inhale 2 puffs into the lungs 2 (two) times daily.   CALCIUM PO Take 400 mg by mouth.   famotidine (PEPCID) 20 MG tablet One after supper   gabapentin (NEURONTIN) 300 MG capsule Take 300 mg by mouth 3 (three) times daily.   HUMIRA PEN 40 MG/0.8ML PNKT Inject 40 mg into the skin every 14 (fourteen) days.   losartan-hydrochlorothiazide (HYZAAR) 50-12.5 MG per tablet Take 1 tablet by mouth daily.   pantoprazole (PROTONIX) 40 MG tablet Take 1 tablet (40 mg total) by mouth daily.   sertraline (ZOLOFT) 100 MG tablet Take 100 mg by mouth daily.            Past Medical History:  Diagnosis Date   Arthritis    Borderline hypertension    Eosinophilic esophagitis    GERD (gastroesophageal reflux disease)    Hyperlipidemia    Hypertension         Objective:     02/15/2022         200   12/09/21 194 lb 9.6 oz (88.3 kg)  06/11/21 196 lb 1.9 oz (89 kg)  06/04/19 190 lb (86.2 kg)      Vital signs reviewed  02/15/2022  - Note at rest 02 sats  94% on RA   General appearance:    amb wm nad   HEENT : Oropharynx  clear     Nasal turbinates nl    NECK :  without  apparent JVD/ palpable Nodes/TM    LUNGS: no acc muscle use,  Nl contour chest which is clear to A and P bilaterally without cough on insp or exp maneuvers   CV:  RRR  no s3 or murmur or  increase in P2, and no edema   ABD:  Mildly obese soft and nontender with nl inspiratory excursion in the supine position. No bruits or organomegaly appreciated   MS:  Nl  gait/ ext warm without deformities Or obvious joint restrictions  calf tenderness, cyanosis or clubbing    SKIN: warm and dry without lesions    NEURO:  alert, approp, nl sensorium with  no motor or cerebellar deficits apparent.           Assessment

## 2022-02-15 NOTE — Progress Notes (Signed)
Full PFT performed today. °

## 2022-02-16 ENCOUNTER — Encounter: Payer: Self-pay | Admitting: Internal Medicine

## 2022-02-16 NOTE — Assessment & Plan Note (Signed)
RA dx 2017/ [facility] Hum[facility] - Sept 6 2022 PFTs mild restriction and imparied DLCO" on report   FVC  3.33 or 80.4% predicted , nl f/v curve  - CT chest 04/14/21 at [facility] Not UIP  Mild bronchiectasis,faint wedge shaped GG changes upper lobes, very minimal fibrosis s honeycombing   - 06/11/2021   Walked on RA x  3  lap(s) =  approx 450 ft @ fast pace, stopped due to end of study with mild sob on 1st lap with lowest 02 sats 96%   - 12/09/2021   Walked on RA  x  3  lap(s) =  approx 450  ft  @ mod pace, stopped due to end of study with lowest 02 sats 91%  And sob on last lap assoc with subjective wheeze - PFT's  02/15/2022   FEV1 2.55 (76 % ) ratio 0.85  p 11 % improvement from saba p 0 prior to study with DLCO  16.30 (63%) FV curve min reversible concavity and ERV 18 at wt 200     Only finding is mild asthma and no def ILD > no change in rx needed   rec continue symbicort 160 2bid and f/u in [place] q 3 m   - The proper method of use, as well as anticipated side effects, of a metered-dose inhaler were discussed and demonstrated to the patient using teach back method          Each maintenance medication was reviewed in detail including emphasizing most importantly the difference between maintenance and prns and under what circumstances the prns are to be triggered using an action plan format where appropriate.  Total time for H and P, chart review, counseling, reviewing hfa device(s) and generating customized AVS unique to this office visit / same day charting =  20 min

## 2022-05-20 ENCOUNTER — Encounter: Payer: Self-pay | Admitting: Internal Medicine

## 2022-05-20 ENCOUNTER — Ambulatory Visit (INDEPENDENT_AMBULATORY_CARE_PROVIDER_SITE_OTHER): Payer: No Typology Code available for payment source | Admitting: Internal Medicine

## 2022-05-20 VITALS — BP 132/78 | HR 74 | Temp 98.2°F | Ht 67.0 in | Wt 195.2 lb

## 2022-05-20 DIAGNOSIS — M0510 Rheumatoid lung disease with rheumatoid arthritis of unspecified site: Secondary | ICD-10-CM | POA: Diagnosis not present

## 2022-05-20 DIAGNOSIS — J45991 Cough variant asthma: Secondary | ICD-10-CM | POA: Diagnosis not present

## 2022-05-20 MED ORDER — BUDESONIDE-FORMOTEROL FUMARATE 80-4.5 MCG/ACT IN AERO: INHALATION_SPRAY | RESPIRATORY_TRACT | 12 refills | Status: DC

## 2022-05-20 NOTE — Assessment & Plan Note (Signed)
RA dx 2017/ [facility] - Sept 6 2022 PFTs mild restriction and imparied DLCO" on report   FVC  3.33 or 80.4% predicted , nl f/v curve  - CT chest 04/14/21 at [facility] Not UIP  Mild bronchiectasis,faint wedge shaped GG changes upper lobes, very minimal fibrosis s honeycombing   - 06/11/2021   Walked on RA x  3  lap(s) =  approx 450 ft @ fast pace, stopped due to end of study with mild sob on 1st lap with lowest 02 sats 96%   - 12/09/2021   Walked on RA  x  3  lap(s) =  approx 450  ft  @ mod pace, stopped due to end of study with lowest 02 sats 91%  And sob on last lap assoc with subjective wheeze - PFT's  02/15/2022   FEV1 2.55 (76 % ) ratio 0.85  p 11 % improvement from saba p 0 prior to study with DLCO  16.30 (63%) FV curve min reversible concavity and ERV 18 at wt 200    - 05/20/2022   Walked on RA  x  3  lap(s) =  approx 450  ft  @ fast pace, stopped due to end of study with lowest 02 sats 93% and some sob on laps 2 and 3    No clinical evidence of progression and he had covid between the two hrct's so best to just follow with serial sats at peak ex and f/u here in 3 m  Discussed in detail all the  indications, usual  risks and alternatives  relative to the benefits with patient who agrees to proceed with Rx as outlined.

## 2022-05-20 NOTE — Assessment & Plan Note (Signed)
Wheeze resolved 05/20/2022 on symb 160 but has slight throat irritation/ throat clearing  - 05/20/2022  After extensive coaching inhaler device,  effectiveness =    80% so try symb 80 2bid and continue max gerd rx  F/u in 3 m  Each maintenance medication was reviewed in detail including emphasizing most importantly the difference between maintenance and prns and under what circumstances the prns are to be triggered using an action plan format where appropriate.  Total time for H and P, chart review, counseling, reviewing hfa device(s) , directly observing portions of ambulatory 02 saturation study/ and generating customized AVS unique to this office visit / same day charting > 30 min

## 2022-05-20 NOTE — Progress Notes (Signed)
Dominic Jackson, male    DOB: [birth year],    MRN: [id number]   Brief patient profile:  56yowm   quit smoking 2005 ? 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 ILD.   RA dx 2017 on humira per [name] since then- was poorly controlled R Knee pain so increased humira 2022. Gained 10 lb since RA   History of Present Illness  06/11/2021  Pulmonary/ 1st office eval/ Dominic Jackson / [place] Office  Chief Complaint  Patient presents with   Consult    [name]   Patient would like flu shot today.   Dyspnea:  walking across a parking lot / no problem shopping   Maybe 500 ft and has to stop at slower pace than others Cough: when breath  in with cold exp but also p shopping  Wheezing when supine Sleep: flat bed / 2-3 pillows SABA use: never  Rec You don't have significant copd or the type of pulmonary fibrosis that is the bad type You appear to RA in your lungs but it is relatively mild Make sure you check your oxygen saturation at your highest level of activity to be sure it stays over 90% and keep track of it at least once a week, more often if breathing getting worse, and let me know if losing ground.  We need the actual PFTs that were done - bring them with you  Continue Pantoprazole (protonix) 40 mg   Take  30-60 min before first meal of the day and Pepcid (famotidine)  20 mg after supper until return to office   GERD diet .      12/09/2021  f/u ov/[place] office/Dominic Jackson re: ? RA lung dz  maint on no rx  Chief Complaint  Patient presents with   Follow-up    Has a little bit of wheezing since last ov.   Dyspnea:  walking uphill about the same = MMRC1 = can walk nl pace, flat grade, can't hurry or go uphills or steps s sob   Cough: has with pollen every year/ uses flonase ' thinks maybe making him wheeze also this year   Sleeping: no resp cc but gf says wheezing at hs/ bed is flat on side / 2 pillows  SABA use: none  02: none  Covid status: never vax / Jan 2023 infected Rec Try breztri  1-2 pffs every 12 hours as needed to see if breathing/ wheezing improve and if so I can call you in Symbicort 160 thru the [name] Work on inhaler technique:    Return to the [name] next available for PFTs and visit with me on the same day but don't use your breztri that day   02/15/2022  f/u ov/Dominic Jackson re: AB / RA   maint on symb 160 2bid  none on ay of ov with main finding ERV 18% at wt 200  Chief Complaint  Patient presents with   Follow-up    No new issues and PFT results  Dyspnea: MMRC1 = can walk nl pace, flat grade, can't hurry or go uphills or steps s sob   Cough: some sense of pnds esp in Sleeping: does fine on side,no noct  SABA use: none  02: none  Covid status:   never vax/ one infection Jan 2023  Rec Plan A = Automatic =  Always=   Symbicort 160 Take 2 puffs first thing in am and then another 2 puffs about 12 hours later.  Please schedule a follow up with all medications /inhalers/ solutions in hand      05/20/2022  f/u ov/Dominic Jackson re: ILD ? RA related  maint on symbicort 160 did not bring meds   Chief Complaint  Patient presents with   Follow-up    Breathing is doing well since last ov.  Brought in some items from [name]   Dyspnea:  MMRC1 = can walk nl pace, flat grade, can't hurry or go uphills or steps s sob  / has to walk some with cane due to back pain  Cough: minimal dry cough daytime/ am produces white mucus  Sleeping: on side flat bed/ not aware of drainage  SABA use: none  02: none  Covid status: never / infected Jan 2023      No obvious day to day or daytime variability or assoc excess/ purulent sputum or mucus plugs or hemoptysis or cp or chest tightness, subjective wheeze or overt sinus or hb symptoms.   Sleeping  without nocturnal  or early am exacerbation  of respiratory   c/o's or need for noct saba. Also denies any obvious fluctuation of symptoms with weather or environmental changes or other aggravating or alleviating factors except as outlined above   No unusual exposure hx or h/o childhood pna/ asthma or knowledge of premature birth.  Current Allergies, Complete Past Medical History, Past Surgical History, Family History, and Social History were reviewed in [employer] record.  ROS  The following are not active complaints unless bolded Hoarseness, sore throat, dysphagia, dental problems, itching, sneezing,  nasal congestion or discharge of excess mucus or purulent secretions, ear ache,   fever, chills, sweats, unintended wt loss or wt gain, classically pleuritic or exertional cp,  orthopnea pnd or arm/hand swelling  or leg swelling, presyncope, palpitations, abdominal pain, anorexia, nausea, vomiting, diarrhea  or change in bowel habits or change in bladder habits, change in stools or change in urine, dysuria, hematuria,  rash, arthralgias=baseline, visual complaints, headache, numbness, weakness or ataxia or problems with walking or coordination,  change in mood or  memory.        Current Meds  Medication Sig   acetaminophen (TYLENOL) 500 MG tablet TAKE ONE TABLET BY MOUTH THREE TIMES A DAY AS NEEDED FOR BACK PAIN   atorvastatin (LIPITOR) 80 MG tablet Take 80 mg by mouth daily.   budesonide-formoterol (SYMBICORT) 160-4.5 MCG/ACT inhaler Inhale 2 puffs into the lungs 2 (two) times daily.   CALCIUM PO Take 400 mg by mouth.   famotidine (PEPCID) 20 MG tablet One after supper   gabapentin (NEURONTIN) 300 MG capsule Take 300 mg by mouth 3 (three) times daily.   HUMIRA PEN 40 MG/0.8ML PNKT Inject 40 mg into the skin every 14 (fourteen) days.   losartan-hydrochlorothiazide (HYZAAR) 50-12.5 MG per tablet Take 1 tablet by mouth daily.   pantoprazole (PROTONIX) 40 MG tablet Take 1 tablet (40 mg total) by mouth daily.   sertraline (ZOLOFT) 100 MG  tablet Take 100 mg by mouth daily.               Past Medical History:  Diagnosis Date   Arthritis    Borderline hypertension    Eosinophilic esophagitis    GERD (gastroesophageal reflux disease)    Hyperlipidemia    Hypertension         Objective:  05/20/2022      195  02/15/2022         200   12/09/21 194 lb 9.6 oz (88.3 kg)  06/11/21 196 lb 1.9 oz (89 kg)  06/04/19 190 lb (86.2 kg)       Vital signs reviewed  05/20/2022  - Note at rest 02 sats  94% on RA   General appearance:    somber amb wm nad    HEENT : Oropharynx  clear      Nasal turbinates nl    NECK :  without  apparent JVD/ palpable Nodes/TM    LUNGS: no acc muscle use,  Nl contour chest which is clear to A and P bilaterally without cough on insp or exp maneuvers   CV:  RRR  no s3 or murmur or increase in P2, and no edema   ABD:  soft and nontender with nl inspiratory excursion in the supine position. No bruits or organomegaly appreciated   MS:  Nl gait/ ext warm without deformities Or obvious joint restrictions  calf tenderness, cyanosis or clubbing    SKIN: warm and dry without lesions    NEURO:  alert, approp, nl sensorium with  no motor or cerebellar deficits apparent.       I personally reviewed images and agree with radiology impression as follows:   Chest HRCT  04/20/22  1. 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   2. Stable dominant 6 mm RLL pulmonary nodule compared to one year  prior and is likely benign however one additional follow-up  thoracic CT suggested in 12 months.     Assessment

## 2022-05-20 NOTE — Patient Instructions (Addendum)
Reduce symbicort to 80 strength Take 2 puffs first thing in am and then another 2 puffs about 12 hours later.     Work on inhaler technique:  relax and gently blow all the way out then take a nice smooth full deep breath back in, triggering the inhaler at same time you start breathing in.  Hold breath in for at least  5 seconds if you can. Blow out symbicort out  thru nose. Rinse and gargle with water when done.  If mouth or throat bother you at all,  try brushing teeth/gums/tongue with arm and hammer toothpaste/ make a slurry and gargle and spit out.   Make sure you check your oxygen saturation at your highest level of activity to be sure it stays over 90% and keep track of it at least once a week, more often if breathing getting worse, and let me know if losing ground.     Please schedule a follow up visit in 3 months but call sooner if needed - bring inhaler

## 2022-08-22 ENCOUNTER — Ambulatory Visit (INDEPENDENT_AMBULATORY_CARE_PROVIDER_SITE_OTHER): Payer: No Typology Code available for payment source | Admitting: Internal Medicine

## 2022-08-22 ENCOUNTER — Encounter: Payer: Self-pay | Admitting: Internal Medicine

## 2022-08-22 VITALS — BP 132/84 | HR 72 | Ht 67.0 in | Wt 196.0 lb

## 2022-08-22 DIAGNOSIS — J45991 Cough variant asthma: Secondary | ICD-10-CM | POA: Diagnosis not present

## 2022-08-22 DIAGNOSIS — M0510 Rheumatoid lung disease with rheumatoid arthritis of unspecified site: Secondary | ICD-10-CM | POA: Diagnosis not present

## 2022-08-22 NOTE — Assessment & Plan Note (Signed)
Wheeze resolved on symb 160 but has slight throat irritation/ throat clearing  - 05/20/2022  After extensive coaching inhaler device,  effectiveness =    80% so try symb 80 2bid and continue max gerd rx - 08/22/2022  After extensive coaching inhaler device,  effectiveness =   90%   All goals of chronic asthma control met including optimal function and elimination of symptoms with minimal need for rescue therapy.  Contingencies discussed in full including contacting this office immediately if not controlling the symptoms using the rule of two's.     Each maintenance medication was reviewed in detail including emphasizing most importantly the difference between maintenance and prns and under what circumstances the prns are to be triggered using an action plan format where appropriate.  Total time for H and P, chart review, counseling, reviewing hfa device(s) , directly observing portions of ambulatory 02 saturation study/ and generating customized AVS unique to this office visit / same day charting = 34 min

## 2022-08-22 NOTE — Progress Notes (Signed)
Dominic Jackson, male    DOB: [birth year],    MRN: [id number]   Brief patient profile:  57  yowm   quit smoking 2005 ? 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 ILD.   RA dx 2017 on humira per Neomia Glass since then- was poorly controlled R Knee pain so increased humira 2022. Gained 10 lb since RA   History of Present Illness  06/11/2021  Pulmonary/ 1st office eval/ Melvyn Novas / [place] Office  Chief Complaint  Patient presents with   Consult    [facility]   Patient would like flu shot today.   Dyspnea:  walking across a parking lot / no problem shopping   Maybe 500 ft and has to stop at slower pace than others Cough: when breath  in with cold exp but also p shopping  Wheezing when supine Sleep: flat bed / 2-3 pillows SABA use: never  Rec You don't have significant copd or the type of pulmonary fibrosis that is the bad type You appear to RA in your lungs but it is relatively mild Make sure you check your oxygen saturation at your highest level of activity to be sure it stays over 90% and keep track of it at least once a week, more often if breathing getting worse, and let me know if losing ground.  We need the actual PFTs that were done - bring them with you  Continue Pantoprazole (protonix) 40 mg   Take  30-60 min before first meal of the day and Pepcid (famotidine)  20 mg after supper until return to office   GERD diet .      12/09/2021  f/u ov/[place] office/Leora Platt re: ? RA lung dz  maint on no rx  Chief Complaint  Patient presents with   Follow-up    Has a little bit of wheezing since last ov.   Dyspnea:  walking uphill about the same = MMRC1 = can walk nl pace, flat grade, can't hurry or go uphills or steps s sob   Cough: has with pollen every year/ uses flonase ' thinks maybe making him wheeze also this year   Sleeping: no resp cc but gf says wheezing at hs/ bed is flat on side / 2 pillows  SABA use: none  02: none  Covid status: never vax / Jan 2023 infected Rec Try breztri  1-2 pffs every 12 hours as needed to see if breathing/ wheezing improve and if so I can call you in Symbicort 160 thru the [place] Work on inhaler technique:    Return to the [place] office next available for PFTs and visit with me on the same day but don't use your breztri that day   02/15/2022  f/u ov/Avyn Aden re: AB / RA   maint on symb 160 2bid  none on ay of ov with main finding ERV 18% at wt 200  Chief Complaint  Patient presents with   Follow-up    No new issues and PFT results  Dyspnea: MMRC1 = can walk nl pace, flat grade, can't hurry or go uphills or steps s sob   Cough: some sense of pnds esp in Sleeping: does fine on side,no noct  SABA use: none  02: none  Covid status:   never vax/ one infection Jan 2023  Rec Plan A =  Automatic = Always=   Symbicort 160 Take 2 puffs first thing in am and then another 2 puffs about 12 hours later.  Please schedule a follow up with all medications /inhalers/ solutions in hand      05/20/2022  f/u ov/[place] office/Debbe Crumble re: ILD ? RA related  maint on symbicort 160 did not bring meds   Chief Complaint  Patient presents with   Follow-up    Breathing is doing well since last ov.  Brought in some items from [place]   Dyspnea:  MMRC1 = can walk nl pace, flat grade, can't hurry or go uphills or steps s sob  / has to walk some with cane due to back pain  Cough: minimal dry cough daytime/ am produces white mucus  Sleeping: on side flat bed/ not aware of drainage  SABA use: none  02: none  Covid status: never / infected Jan 2023  Rec Reduce symbicort to 80 strength Take 2 puffs first thing in am and then another 2 puffs about 12 hours later.  Work on inhaler technique:   Make sure you check your oxygen saturation at your highest level of activity to be sure it stays over 90%   Please  schedule a follow up visit in 3 months but call sooner if needed - bring inhaler    08/22/2022  f/u ov/[place] office/Nyasha Rahilly re: RA lung dz  maint on symbicort 80 2bid   Chief Complaint  Patient presents with   Follow-up    Feels breathing is bothering him   Dyspnea:  about the same / very active active at work climbing  Cough: variable Sleeping: on side flat bed 2 pillows  SABA use: none  02: none  Covid status: never vaccinated / infected jan 203      No obvious day to day or daytime variability or assoc excess/ purulent sputum or mucus plugs or hemoptysis or cp or chest tightness, subjective wheeze or overt sinus or hb symptoms.   Sleeping  without nocturnal  or early am exacerbation  of respiratory  c/o's or need for noct saba. Also denies any obvious fluctuation of symptoms with weather or environmental changes or other aggravating or alleviating factors except as outlined above   No unusual exposure hx or h/o childhood pna/ asthma or knowledge of premature birth.  Current Allergies, Complete Past Medical History, Past Surgical History, Family History, and Social History were reviewed in [employer] record.  ROS  The following are not active complaints unless bolded Hoarseness, sore throat, dysphagia, dental problems, itching, sneezing,  nasal congestion or discharge of excess mucus or purulent secretions, ear ache,   fever, chills, sweats, unintended wt loss or wt gain, classically pleuritic or exertional cp,  orthopnea pnd or arm/hand swelling  or leg swelling, presyncope, palpitations, abdominal pain, anorexia, nausea, vomiting, diarrhea  or change in bowel habits or change in bladder habits, change in stools or change in urine, dysuria, hematuria,  rash, arthralgias, visual complaints, headache, numbness, weakness or ataxia or problems with walking or coordination,  change in mood or  memory.        Current Meds  Medication Sig   acetaminophen (TYLENOL)  500 MG tablet TAKE ONE TABLET BY MOUTH THREE TIMES A DAY AS NEEDED FOR BACK PAIN   atorvastatin (LIPITOR) 80 MG tablet Take 80 mg by mouth daily.   budesonide-formoterol (SYMBICORT) 80-4.5 MCG/ACT inhaler Take 2 puffs first thing in am and then another 2 puffs about 12 hours later.  CALCIUM PO Take 400 mg by mouth.   famotidine (PEPCID) 20 MG tablet One after supper   gabapentin (NEURONTIN) 300 MG capsule Take 300 mg by mouth 3 (three) times daily.   HUMIRA PEN 40 MG/0.8ML PNKT Inject 40 mg into the skin every 14 (fourteen) days.   losartan-hydrochlorothiazide (HYZAAR) 50-12.5 MG per tablet Take 1 tablet by mouth daily.   pantoprazole (PROTONIX) 40 MG tablet Take 1 tablet (40 mg total) by mouth daily.   sertraline (ZOLOFT) 100 MG tablet Take 100 mg by mouth daily.                 Past Medical History:  Diagnosis Date   Arthritis    Borderline hypertension    Eosinophilic esophagitis    GERD (gastroesophageal reflux disease)    Hyperlipidemia    Hypertension         Objective:    Wts  08/22/2022        196  05/20/2022      195  02/15/2022         200   12/09/21 194 lb 9.6 oz (88.3 kg)  06/11/21 196 lb 1.9 oz (89 kg)  06/04/19 190 lb (86.2 kg)     Vital signs reviewed  08/22/2022  - Note at rest 02 sats  94% on RA   General appearance:    amb wm nad   HEENT : Oropharynx  clear     Nasal turbinates nl   NECK :  without  apparent JVD/ palpable Nodes/TM    LUNGS: no acc muscle use,  Nl contour chest which is clear to A and P bilaterally without cough on insp or exp maneuvers   CV:  RRR  no s3 or murmur or increase in P2, and no edema   ABD:  soft and nontender with nl inspiratory excursion in the supine position. No bruits or organomegaly appreciated   MS:  Nl gait/ ext warm without deformities Or obvious joint restrictions  calf tenderness, cyanosis or clubbing    SKIN: warm and dry without lesions    NEURO:  alert, approp, nl sensorium with  no motor or  cerebellar deficits apparent.       I personally reviewed images and agree with radiology impression as follows:   Chest HRCT  04/20/22  1. 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   2. Stable dominant 6 mm RLL pulmonary nodule compared to one year  prior and is likely benign however one additional follow-up  thoracic CT suggested in 12 months.     Assessment

## 2022-08-22 NOTE — Assessment & Plan Note (Signed)
RA dx 2017/ [facility] - Sept 6 2022 PFTs mild restriction and imparied DLCO" on report   FVC  3.33 or 80.4% predicted , nl f/v curve  - CT chest 04/14/21 at [facility] Not UIP  Mild bronchiectasis,faint wedge shaped GG changes upper lobes, very minimal fibrosis s honeycombing   - 06/11/2021   Walked on RA x  3  lap(s) =  approx 450 ft @ fast pace, stopped due to end of study with mild sob on 1st lap with lowest 02 sats 96%   - 12/09/2021   Walked on RA  x  3  lap(s) =  approx 450  ft  @ mod pace, stopped due to end of study with lowest 02 sats 91%  And sob on last lap assoc with subjective wheeze - PFT's  02/15/2022   FEV1 2.55 (76 % ) ratio 0.85  p 11 % improvement from saba p 0 prior to study with DLCO  16.30 (63%) FV curve min reversible concavity and ERV 18 at wt 200    -  Chest HRCT  04/20/22  1. 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  - 05/20/2022   Walked on RA  x  3  lap(s) =  approx 450  ft  @ fast pace, stopped due to end of study with lowest 02 sats 93% and some sob on laps 2 and 3   - 08/22/2022   Walked on RA  x  3  lap(s) =  approx 450  ft  @ fastpace, stopped due to end of study  with lowest 02 sats 91%  with sob on laps 2 and 3   Unclear whether symptom are really progressing or not as pt is actually doing more and no longer having the wheezing component on symb 80 2bid so advised:  Make sure you check your oxygen saturation at your highest level of activity(NOT after you stop)  to be sure it stays over 90% and keep track of it at least once a week, more often if breathing getting worse, and let me know if losing ground. (Collect the dots to connect the dots approach)    HRCT at [facility] 04/2023 and f/u with me afterwards

## 2022-08-22 NOTE — Patient Instructions (Addendum)
Work on optimal  inhaler technique:  relax and gently blow all the way out then take a nice smooth full deep breath back in, triggering the inhaler at same time you start breathing in.  Hold breath in for at least  5 seconds if you can. Blow out Symbicort 80 thru nose. Rinse and gargle with water when done.  If mouth or throat bother you at all,  try brushing teeth/gums/tongue with arm and hammer toothpaste/ make a slurry and gargle and spit out.   Make sure you check your oxygen saturation at your highest level of activity(NOT after you stop)  to be sure it stays over 90% and keep track of it at least once a week  (Collect the dots to connect the dots approach)    If you are losing ground with your breathing with exertion or 0xygen saturations are dropping at peak exertion, you need a repeat High Resolution CT chest otherwise can wait until Oct 2024   Please schedule a follow up visit for late Oct 2024  after your CT chest at the [facility]

## 2022-09-21 ENCOUNTER — Telehealth: Payer: Self-pay | Admitting: Internal Medicine

## 2022-09-21 NOTE — Telephone Encounter (Signed)
Ov notes faxed to [place]

## 2022-09-21 NOTE — Telephone Encounter (Signed)
Dominic Jackson would like office notes for [facility] 08/22/2022. Dominic Jackson phone number is [phone].Fax number is [phone].

## 2023-01-17 ENCOUNTER — Other Ambulatory Visit: Payer: Self-pay | Admitting: Neurological Surgery

## 2023-01-20 NOTE — Pre-Procedure Instructions (Signed)
Surgical Instructions   Your procedure is scheduled on Friday, July 19th. Report to [facility] Main Entrance "A" at 09:20 A.M., then check in with the Admitting office. Any questions or running late day of surgery: call [phone]  Questions prior to your surgery date: call [phone], Monday-Friday, 8am-4pm. If you experience any cold or flu symptoms such as cough, fever, chills, shortness of breath, etc. between now and your scheduled surgery, please notify us at the above number.     Remember:  Do not eat after midnight the night before your surgery  You may drink clear liquids until 08:20 AM the morning of your surgery.   Clear liquids allowed are: Water, Non-Citrus Juices (without pulp), Carbonated Beverages, Clear Tea, Black Coffee Only (NO MILK, CREAM OR POWDERED CREAMER of any kind), and Gatorade.    Take these medicines the morning of surgery with A SIP OF WATER  pantoprazole (PROTONIX)     May take these medicines IF NEEDED: acetaminophen (TYLENOL)     Follow your prescriber's instructions regarding HUMIRA.  One week prior to surgery, STOP taking any Aspirin (unless otherwise instructed by your surgeon) Aleve, Naproxen, Ibuprofen, meloxicam (MOBIC), Motrin, Advil, Goody's, BC's, all herbal medications, fish oil, and non-prescription vitamins.                     Do NOT Smoke (Tobacco/Vaping) for 24 hours prior to your procedure.  If you use a CPAP at night, you may bring your mask/headgear for your overnight stay.   You will be asked to remove any contacts, glasses, piercing's, hearing aid's, dentures/partials prior to surgery. Please bring cases for these items if needed.    Patients discharged the day of surgery will not be allowed to drive home, and someone needs to stay with them for 24 hours.  SURGICAL WAITING ROOM VISITATION Patients may have no more than 2 support people in the waiting area - these visitors may rotate.   Pre-op nurse will coordinate an  appropriate time for 1 ADULT support person, who may not rotate, to accompany patient in pre-op.  Children under the age of 71 must have an adult with them who is not the patient and must remain in the main waiting area with an adult.  If the patient needs to stay at the hospital during part of their recovery, the visitor guidelines for inpatient rooms apply.  Please refer to the [facility] website for the visitor guidelines for any additional information.   If you received a COVID test during your pre-op visit  it is requested that you wear a mask when out in public, stay away from anyone that may not be feeling well and notify your surgeon if you develop symptoms. If you have been in contact with anyone that has tested positive in the last 10 days please notify you surgeon.      Pre-operative 5 CHG Bath Instructions   You can play a key role in reducing the risk of infection after surgery. Your skin needs to be as free of germs as possible. You can reduce the number of germs on your skin by washing with CHG (chlorhexidine gluconate) soap before surgery. CHG is an antiseptic soap that kills germs and continues to kill germs even after washing.   DO NOT use if you have an allergy to chlorhexidine/CHG or antibacterial soaps. If your skin becomes reddened or irritated, stop using the CHG and notify one of our RNs at [phone].   Please shower  with the CHG soap starting 4 days before surgery using the following schedule:     Please keep in mind the following:  DO NOT shave, including legs and underarms, starting the day of your first shower.   You may shave your face at any point before/day of surgery.  Place clean sheets on your bed the day you start using CHG soap. Use a clean washcloth (not used since being washed) for each shower. DO NOT sleep with pets once you start using the CHG.   CHG Shower Instructions:  If you choose to wash your hair and private area, wash first with your  normal shampoo/soap.  After you use shampoo/soap, rinse your hair and body thoroughly to remove shampoo/soap residue.  Turn the water OFF and apply about 3 tablespoons (45 ml) of CHG soap to a CLEAN washcloth.  Apply CHG soap ONLY FROM YOUR NECK DOWN TO YOUR TOES (washing for 3-5 minutes)  DO NOT use CHG soap on face, private areas, open wounds, or sores.  Pay special attention to the area where your surgery is being performed.  If you are having back surgery, having someone wash your back for you may be helpful. Wait 2 minutes after CHG soap is applied, then you may rinse off the CHG soap.  Pat dry with a clean towel  Put on clean clothes/pajamas   If you choose to wear lotion, please use ONLY the CHG-compatible lotions on the back of this paper.   Additional instructions for the day of surgery: DO NOT APPLY any lotions, deodorants, cologne, or perfumes.   Do not bring valuables to the hospital. [facility] is not responsible for any belongings/valuables. Do not wear nail polish, gel polish, artificial nails, or any other type of covering on natural nails (fingers and toes) Do not wear jewelry or makeup Put on clean/comfortable clothes.  Please brush your teeth.  Ask your nurse before applying any prescription medications to the skin.     CHG Compatible Lotions   Aveeno Moisturizing lotion  Cetaphil Moisturizing Cream  Cetaphil Moisturizing Lotion  Clairol Herbal Essence Moisturizing Lotion, Dry Skin  Clairol Herbal Essence Moisturizing Lotion, Extra Dry Skin  Clairol Herbal Essence Moisturizing Lotion, Normal Skin  Curel Age Defying Therapeutic Moisturizing Lotion with Alpha Hydroxy  Curel Extreme Care Body Lotion  Curel Soothing Hands Moisturizing Hand Lotion  Curel Therapeutic Moisturizing Cream, Fragrance-Free  Curel Therapeutic Moisturizing Lotion, Fragrance-Free  Curel Therapeutic Moisturizing Lotion, Original Formula  Eucerin Daily Replenishing Lotion  Eucerin Dry Skin  Therapy Plus Alpha Hydroxy Crme  Eucerin Dry Skin Therapy Plus Alpha Hydroxy Lotion  Eucerin Original Crme  Eucerin Original Lotion  Eucerin Plus Crme Eucerin Plus Lotion  Eucerin TriLipid Replenishing Lotion  Keri Anti-Bacterial Hand Lotion  Keri Deep Conditioning Original Lotion Dry Skin Formula Softly Scented  Keri Deep Conditioning Original Lotion, Fragrance Free Sensitive Skin Formula  Keri Lotion Fast Absorbing Fragrance Free Sensitive Skin Formula  Keri Lotion Fast Absorbing Softly Scented Dry Skin Formula  Keri Original Lotion  Keri Skin Renewal Lotion Keri Silky Smooth Lotion  Keri Silky Smooth Sensitive Skin Lotion  Nivea Body Creamy Conditioning Oil  Nivea Body Extra Enriched Teacher, adult education Moisturizing Lotion Nivea Crme  Nivea Skin Firming Lotion  NutraDerm 30 Skin Lotion  NutraDerm Skin Lotion  NutraDerm Therapeutic Skin Cream  NutraDerm Therapeutic Skin Lotion  ProShield Protective Hand Cream  Provon moisturizing lotion  Please read over the following fact sheets  that you were given.

## 2023-01-23 ENCOUNTER — Other Ambulatory Visit: Payer: Self-pay

## 2023-01-23 ENCOUNTER — Encounter (HOSPITAL_COMMUNITY): Payer: Self-pay

## 2023-01-23 ENCOUNTER — Encounter (HOSPITAL_COMMUNITY)
Admission: RE | Admit: 2023-01-23 | Discharge: 2023-01-23 | Disposition: A | Discharge status: Home / Self Care | Payer: No Typology Code available for payment source | Source: Ambulatory Visit | Attending: Neurological Surgery | Admitting: Neurological Surgery

## 2023-01-23 VITALS — BP 131/100 | HR 61 | Temp 98.5°F | Resp 18 | Ht 66.0 in | Wt 194.0 lb

## 2023-01-23 DIAGNOSIS — K219 Gastro-esophageal reflux disease without esophagitis: Secondary | ICD-10-CM | POA: Insufficient documentation

## 2023-01-23 DIAGNOSIS — R7303 Prediabetes: Secondary | ICD-10-CM | POA: Diagnosis not present

## 2023-01-23 DIAGNOSIS — E785 Hyperlipidemia, unspecified: Secondary | ICD-10-CM | POA: Insufficient documentation

## 2023-01-23 DIAGNOSIS — K200 Eosinophilic esophagitis: Secondary | ICD-10-CM | POA: Insufficient documentation

## 2023-01-23 DIAGNOSIS — M5001 Cervical disc disorder with myelopathy,  high cervical region: Secondary | ICD-10-CM | POA: Insufficient documentation

## 2023-01-23 DIAGNOSIS — Z7951 Long term (current) use of inhaled steroids: Secondary | ICD-10-CM | POA: Insufficient documentation

## 2023-01-23 DIAGNOSIS — Z01818 Encounter for other preprocedural examination: Secondary | ICD-10-CM | POA: Insufficient documentation

## 2023-01-23 DIAGNOSIS — M2578 Osteophyte, vertebrae: Secondary | ICD-10-CM | POA: Insufficient documentation

## 2023-01-23 DIAGNOSIS — M069 Rheumatoid arthritis, unspecified: Secondary | ICD-10-CM | POA: Diagnosis not present

## 2023-01-23 DIAGNOSIS — F419 Anxiety disorder, unspecified: Secondary | ICD-10-CM | POA: Diagnosis not present

## 2023-01-23 DIAGNOSIS — Z87891 Personal history of nicotine dependence: Secondary | ICD-10-CM | POA: Insufficient documentation

## 2023-01-23 DIAGNOSIS — J45909 Unspecified asthma, uncomplicated: Secondary | ICD-10-CM | POA: Diagnosis not present

## 2023-01-23 DIAGNOSIS — I10 Essential (primary) hypertension: Secondary | ICD-10-CM | POA: Insufficient documentation

## 2023-01-23 HISTORY — DX: Prediabetes: R73.03

## 2023-01-23 HISTORY — DX: Dyspnea, unspecified: R06.00

## 2023-01-23 HISTORY — DX: Anxiety disorder, unspecified: F41.9

## 2023-01-23 LAB — CBC
HCT: 45.3 % (ref 39.0–52.0)
Hemoglobin: 15 g/dL (ref 13.0–17.0)
MCH: 28.5 pg (ref 26.0–34.0)
MCHC: 33.1 g/dL (ref 30.0–36.0)
MCV: 86 fL (ref 80.0–100.0)
Platelets: 209 10*3/uL (ref 150–400)
RBC: 5.27 MIL/uL (ref 4.22–5.81)
RDW: 12.9 % (ref 11.5–15.5)
WBC: 7.2 10*3/uL (ref 4.0–10.5)
nRBC: 0 % (ref 0.0–0.2)

## 2023-01-23 LAB — BASIC METABOLIC PANEL
Anion gap: 9 (ref 5–15)
BUN: 15 mg/dL (ref 6–20)
CO2: 24 mmol/L (ref 22–32)
Calcium: 8.9 mg/dL (ref 8.9–10.3)
Chloride: 102 mmol/L (ref 98–111)
Creatinine, Ser: 1.01 mg/dL (ref 0.61–1.24)
GFR, Estimated: >60 mL/min (ref >60)
Glucose, Bld: 130 mg/dL — ABNORMAL HIGH (ref 70–99)
Potassium: 4.3 mmol/L (ref 3.5–5.1)
Sodium: 135 mmol/L (ref 135–145)

## 2023-01-23 LAB — SURGICAL PCR SCREEN
MRSA, PCR: NEGATIVE
Staphylococcus aureus: POSITIVE — AB

## 2023-01-23 LAB — TYPE AND SCREEN
ABO/RH(D): A POS
Antibody Screen: NEGATIVE

## 2023-01-23 NOTE — Progress Notes (Addendum)
PCP - Freada Bergeron, MD Cardiologist - denies Pulmonology - Sandrea Hughs, MD  PPM/ICD - denies Device Orders -  Rep Notified -   Chest x-ray -  EKG - 01/23/23 Stress Test - denies ECHO - pt states he had one at the [facility] approximately one year ago due to shortness of breath. Results requested.  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   Patient denies shortness of breath, fever, cough and chest pain at PAT appointment   All instructions explained to the patient, with a verbal understanding of the material. Patient agrees to go over the instructions while at home for a better understanding. Patient also instructed to wear a mask when out in public prior to surgery. The opportunity to ask questions was provided.

## 2023-01-24 NOTE — Progress Notes (Signed)
Anesthesia Chart Review:   Case: [id number] Date/Time: 01/27/23 1415   Procedure: C3-4 ACDF - RM 21 to follow 3C   Anesthesia type: General   Pre-op diagnosis: Cervical myelopathy   [facility] ROOM 19 / [facility] OR   Surgeons: Arman Bogus, MD       DISCUSSION: Patient is a 57 year old male scheduled for the above procedure.  History includes former smoker (quit 07/12/03), HTN, HLD, pre-diabetes, dyspnea, cough variant asthma, anxiety, eosinophilic esophagitis, GERD, RA (diagnosed in 2017).   He is followed by pulmonologist Dr. [name] for cough variant asthma and rheumatoid lung disease. RA is followed by Dr. Mariel Craft at the [facility]. He also gets imaging there.  Last visit with Dr. [name] was on 08/22/22. 04/2022 showed some progression of ground glass opacities, but patient was actually more active and no longer wheezing on Symbicort. Plan to follow-up after he has surveillance HRCT at [facility] in 04/2023. [facility] PCP appears to be following 5-6 mm RLL lung nodule.  He had echo ordered as part of dyspnea evaluation in 2022. 03/30/21 echo showed mild LVH, EF 68%, no regional wall motion abnormalities, normal diastolic function, trace MR/TR, mild AV thickening but no hemodynamically significant AS.  He denies chest pain.  PAT EKG showed sinus bradycardia with sinus arrhythmia.  Anesthesia team to evaluate on the day of surgery.   VS: BP (!) 131/100   Pulse 61   Temp 36.9 C   Resp 18   Ht 5\' 6"  (1.676 m)   Wt 88 kg   SpO2 95%   BMI 31.31 kg/m  BP Readings from Last 3 Encounters:  01/23/23 (!) 131/100  08/22/22 132/84  05/20/22 132/78    PROVIDERS: Freada Bergeron, MD is PCP Liliane Channel) Sandrea Hughs, MD is pulmonologist Florestine Avers, MD is rheumatologist [facility]). Last visit 10/26/22 and was overall doing well on Humira Q 7 days. Discuss imaging for left hand numbness which ultimately prompted MRI C-spine.   LABS: Labs reviewed: Acceptable for surgery. A1c  6.0% 05/02/22 ([facility]) (all labs ordered are listed, but only abnormal results are displayed)  Labs Reviewed  SURGICAL PCR SCREEN - Abnormal; Notable for the following components:      Result Value   Staphylococcus aureus POSITIVE (*)    All other components within normal limits  BASIC METABOLIC PANEL - Abnormal; Notable for the following components:   Glucose, Bld 130 (*)    All other components within normal limits  CBC  TYPE AND SCREEN    OTHER: Pulmonary Studies as outlined by Dr. [name]: - 08/22/2022   Walked on RA  x  3  lap(s) =  approx 450  ft  @ fastpace, stopped due to end of study  with lowest 02 sats 91%  with sob on laps 2 and 3   - PFT's  02/15/2022   FEV1 2.55 (76 % ) ratio 0.85  p 11 % improvement from saba p 0 prior to study with DLCO  16.30 (63%) FV curve min reversible concavity and ERV 18 at wt 200      IMAGES: MRI C-spine 01/05/23 [facility]): Impression: 1. There is a large posterior disc osteophyte complex with right  and left paracentral large disc protrusions at C3-C4 level  resulting in severe mass effect on the cervical cord with severe  flattening of the cord as well as cord signal abnormality that  could represent myelomalacia versus cord edema. There is severe  spinal canal stenosis at this level. Recommend clinical  correlation and neurosurgical consultation.  2. Additional findings as above. [See Care Everywhere]  CT Thorax 04/20/22 ([facility]): Impression: 1. 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  2. Stable dominant 6 mm RLL pulmonary nodule compared to one year prior and is likely benign however one additional follow-up thoracic CT suggested in 12  months.  - Comparison Chest CT 04/14/21 [facility]) showed non-specific bilateral ground glass wedge-shaped subpleural opacities predominant in uuper lobes and basilar aspects of lower lobes, consider acute to subactue COVID 19 infection/pneumonitis versus other inflammatory process given RA history, very mild fibortic changes in both lung bases, no consistent with UIP pattern, stable 5-6 mm RLL nodule.   EKG: EKG 01/23/23: Sinus bradycardia at 56 bpm with sinus arrhythmia Otherwise normal ECG No previous ECGs available Confirmed by Carolan Clines ([id number]) on 01/23/2023 11:25:25 AM   CV: US Carotid 01/05/23 ([facility];ordered after 10/26/22 c-spine xray done at [facility] showed right carotid calcifications): Impression: No evidence for hemodynamically significant stenosis.    Echo 03/30/21 ([facility]): Summary: There is mild concentric left ventricular hypertrophy. Left ventricular systolic function is normal.  EF 68% by 2D biplane method. No regional wall motion abnormalities noted. Tissue Doppler sampling consistent with normal diastolic function. There is mild aortic valve thickening.  The aortic valve opens well.  No hemodynamically significant valvular aortic stenosis. There is trace mitral regurgitation. There is trace tricuspid regurgitation. Right ventricular systolic pressure unable to be evaluated due to insufficient TR. There is no comparison study available.   Past Medical History:  Diagnosis Date   Anxiety    Arthritis    Borderline hypertension    Dyspnea    Eosinophilic esophagitis    GERD (gastroesophageal reflux disease)    Hyperlipidemia    Hypertension    Pre-diabetes     Past Surgical History:  Procedure Laterality Date   ESOPHAGOGASTRODUODENOSCOPY  07/29/2011   Esophagoscopy with removal of food impaction, biopsies suggested eosinophilic esophagitis   ESOPHAGOGASTRODUODENOSCOPY N/A 06/28/2015   Procedure: ESOPHAGOGASTRODUODENOSCOPY (EGD);  Surgeon: Meryl Dare, MD;  Location: [facility];  Service: Endoscopy;  Laterality: N/A;   KNEE SURGERY Right     MEDICATIONS:  acetaminophen (TYLENOL) 500 MG tablet   ammonium lactate (LAC-HYDRIN) 12 % lotion   atorvastatin (LIPITOR) 80 MG tablet   budesonide-formoterol (SYMBICORT) 80-4.5 MCG/ACT inhaler   famotidine (PEPCID) 20 MG tablet   gabapentin (NEURONTIN) 300 MG capsule   HUMIRA PEN 40 MG/0.8ML PNKT   losartan-hydrochlorothiazide (HYZAAR) 50-12.5 MG per tablet   meloxicam (MOBIC) 15 MG tablet   pantoprazole (PROTONIX) 40 MG tablet   sertraline (ZOLOFT) 100 MG tablet   No current facility-administered medications for this encounter.    Shonna Chock, PA-C Surgical Short Stay/Anesthesiology [facility] Phone [phone] [facility] Phone [phone] 01/24/2023 4:32 PM

## 2023-01-24 NOTE — Anesthesia Preprocedure Evaluation (Addendum)
Anesthesia Evaluation  Patient identified by MRN, date of birth, ID band Patient awake    Reviewed: Allergy & Precautions, NPO status , Patient's Chart, lab work & pertinent test results  History of Anesthesia Complications Negative for: history of anesthetic complications  Airway Mallampati: II  TM Distance: >3 FB Neck ROM: Full    Dental no notable dental hx. (+) Dental Advisory Given   Pulmonary asthma , former smoker   Pulmonary exam normal        Cardiovascular hypertension, Pt. on medications Normal cardiovascular exam  03/30/21 echo showed mild LVH, EF 68%, no regional wall motion abnormalities, normal diastolic function, trace MR/TR, mild AV thickening but no hemodynamically significant AS.  He denies chest pain.  PAT EKG showed sinus bradycardia with sinus arrhythmia.   Neuro/Psych negative neurological ROS     GI/Hepatic negative GI ROS, Neg liver ROS,,,  Endo/Other  negative endocrine ROS    Renal/GU negative Renal ROS     Musculoskeletal negative musculoskeletal ROS (+)    Abdominal   Peds  Hematology negative hematology ROS (+)   Anesthesia Other Findings   Reproductive/Obstetrics                             Anesthesia Physical Anesthesia Plan  ASA: 2  Anesthesia Plan: General   Post-op Pain Management: Tylenol PO (pre-op)*   Induction: Intravenous  PONV Risk Score and Plan: 4 or greater and Ondansetron, Dexamethasone, Midazolam and Scopolamine patch - Pre-op  Airway Management Planned: Video Laryngoscope Planned and Oral ETT  Additional Equipment:   Intra-op Plan:   Post-operative Plan: Extubation in OR  Informed Consent: I have reviewed the patients History and Physical, chart, labs and discussed the procedure including the risks, benefits and alternatives for the proposed anesthesia with the patient or authorized representative who has indicated his/her  understanding and acceptance.     Dental advisory given  Plan Discussed with: Anesthesiologist and CRNA  Anesthesia Plan Comments: (PAT note written 01/24/2023 by Shonna Chock, PA-C.  )       Anesthesia Quick Evaluation

## 2023-01-26 NOTE — Progress Notes (Signed)
Patient was called to inform that the surgery time for tomorrow was changed to 14:30 o'clock. This writer left a message and patient was instructed to be at the hospital at 12:30 o'clock and stop drinking clear liquids at 11:30 o'clock.

## 2023-01-27 NOTE — Progress Notes (Signed)
Patient called and made aware of surgery cancellation due to system outage.

## 2023-01-31 NOTE — Progress Notes (Signed)
Updated patient with arrival time of 0730 for surgery on 02/01/2023.  ERAS until 0700

## 2023-02-01 ENCOUNTER — Ambulatory Visit (HOSPITAL_COMMUNITY): Payer: No Typology Code available for payment source | Admitting: Vascular Surgery

## 2023-02-01 ENCOUNTER — Ambulatory Visit (HOSPITAL_COMMUNITY): Payer: No Typology Code available for payment source

## 2023-02-01 ENCOUNTER — Encounter (HOSPITAL_COMMUNITY): Payer: Self-pay | Admitting: Neurological Surgery

## 2023-02-01 ENCOUNTER — Observation Stay (HOSPITAL_COMMUNITY)
Admission: RE | Admit: 2023-02-01 | Discharge: 2023-02-01 | Disposition: A | Discharge status: Home / Self Care | Payer: No Typology Code available for payment source | Attending: Neurological Surgery | Admitting: Neurological Surgery

## 2023-02-01 ENCOUNTER — Other Ambulatory Visit: Payer: Self-pay

## 2023-02-01 ENCOUNTER — Encounter (HOSPITAL_COMMUNITY)
Admission: RE | Disposition: A | Discharge status: Home / Self Care | Payer: Self-pay | Source: Home / Self Care | Attending: Neurological Surgery

## 2023-02-01 ENCOUNTER — Ambulatory Visit (HOSPITAL_BASED_OUTPATIENT_CLINIC_OR_DEPARTMENT_OTHER): Payer: No Typology Code available for payment source | Admitting: Certified Registered"

## 2023-02-01 DIAGNOSIS — Z87891 Personal history of nicotine dependence: Secondary | ICD-10-CM | POA: Diagnosis not present

## 2023-02-01 DIAGNOSIS — Z981 Arthrodesis status: Principal | ICD-10-CM

## 2023-02-01 DIAGNOSIS — M5001 Cervical disc disorder with myelopathy,  high cervical region: Secondary | ICD-10-CM | POA: Diagnosis present

## 2023-02-01 DIAGNOSIS — Z79899 Other long term (current) drug therapy: Secondary | ICD-10-CM | POA: Insufficient documentation

## 2023-02-01 DIAGNOSIS — M4802 Spinal stenosis, cervical region: Secondary | ICD-10-CM | POA: Diagnosis not present

## 2023-02-01 DIAGNOSIS — M4712 Other spondylosis with myelopathy, cervical region: Secondary | ICD-10-CM | POA: Diagnosis not present

## 2023-02-01 DIAGNOSIS — I10 Essential (primary) hypertension: Secondary | ICD-10-CM | POA: Diagnosis not present

## 2023-02-01 HISTORY — PX: ANTERIOR CERVICAL DECOMP/DISCECTOMY FUSION: SHX1161

## 2023-02-01 LAB — GLUCOSE, CAPILLARY: Glucose-Capillary: 120 mg/dL — ABNORMAL HIGH (ref 70–99)

## 2023-02-01 LAB — ABO/RH: ABO/RH(D): A POS

## 2023-02-01 SURGERY — ANTERIOR CERVICAL DECOMPRESSION/DISCECTOMY FUSION 1 LEVEL
Anesthesia: General

## 2023-02-01 MED ORDER — PROPOFOL 10 MG/ML IV BOLUS
INTRAVENOUS | Status: DC | PRN
Start: 2023-02-01 — End: 2023-02-01
  Administered 2023-02-01: 160 mg via INTRAVENOUS

## 2023-02-01 MED ORDER — LIDOCAINE 2% (20 MG/ML) 5 ML SYRINGE
INTRAMUSCULAR | Status: AC
  Filled 2023-02-01: qty 5

## 2023-02-01 MED ORDER — LOSARTAN POTASSIUM-HCTZ 50-12.5 MG PO TABS: 1.0000 | ORAL_TABLET | Freq: Every day | ORAL | Status: DC

## 2023-02-01 MED ORDER — CHLORHEXIDINE GLUCONATE CLOTH 2 % EX PADS: 6.0000 | MEDICATED_PAD | Freq: Once | CUTANEOUS | Status: DC

## 2023-02-01 MED ORDER — LOSARTAN POTASSIUM 50 MG PO TABS: 50.0000 mg | ORAL_TABLET | Freq: Every day | ORAL | Status: DC

## 2023-02-01 MED ORDER — METHOCARBAMOL 500 MG PO TABS
500.0000 mg | ORAL_TABLET | Freq: Four times a day (QID) | ORAL | Status: DC | PRN
  Administered 2023-02-01: 500 mg via ORAL

## 2023-02-01 MED ORDER — ORAL CARE MOUTH RINSE: 15.0000 mL | Freq: Once | OROMUCOSAL | Status: AC

## 2023-02-01 MED ORDER — SUCCINYLCHOLINE CHLORIDE 200 MG/10ML IV SOSY
PREFILLED_SYRINGE | INTRAVENOUS | Status: AC
  Filled 2023-02-01: qty 10

## 2023-02-01 MED ORDER — HYDROCODONE-ACETAMINOPHEN 10-325 MG PO TABS: 1.0000 | ORAL_TABLET | ORAL | Status: DC | PRN

## 2023-02-01 MED ORDER — METHOCARBAMOL 500 MG PO TABS
ORAL_TABLET | ORAL | Status: AC
  Filled 2023-02-01: qty 1

## 2023-02-01 MED ORDER — AMISULPRIDE (ANTIEMETIC) 5 MG/2ML IV SOLN: 10.0000 mg | Freq: Once | INTRAVENOUS | Status: DC | PRN

## 2023-02-01 MED ORDER — PHENYLEPHRINE 80 MCG/ML (10ML) SYRINGE FOR IV PUSH (FOR BLOOD PRESSURE SUPPORT)
PREFILLED_SYRINGE | INTRAVENOUS | Status: AC
  Filled 2023-02-01: qty 10

## 2023-02-01 MED ORDER — GABAPENTIN 300 MG PO CAPS: 600.0000 mg | ORAL_CAPSULE | Freq: Every day | ORAL | Status: DC

## 2023-02-01 MED ORDER — SODIUM CHLORIDE 0.9 % IV SOLN
250.0000 mL | INTRAVENOUS | Status: DC
  Administered 2023-02-01: 250 mL via INTRAVENOUS

## 2023-02-01 MED ORDER — MORPHINE SULFATE (PF) 2 MG/ML IV SOLN: 2.0000 mg | INTRAVENOUS | Status: DC | PRN

## 2023-02-01 MED ORDER — DEXAMETHASONE SODIUM PHOSPHATE 4 MG/ML IJ SOLN: 4.0000 mg | Freq: Four times a day (QID) | INTRAMUSCULAR | Status: DC

## 2023-02-01 MED ORDER — ONDANSETRON HCL 4 MG/2ML IJ SOLN
INTRAMUSCULAR | Status: AC
  Filled 2023-02-01: qty 2

## 2023-02-01 MED ORDER — SODIUM CHLORIDE 0.9% FLUSH: 3.0000 mL | Freq: Two times a day (BID) | INTRAVENOUS | Status: DC

## 2023-02-01 MED ORDER — POTASSIUM CHLORIDE IN NACL 20-0.9 MEQ/L-% IV SOLN: INTRAVENOUS | Status: DC

## 2023-02-01 MED ORDER — ACETAMINOPHEN 10 MG/ML IV SOLN
INTRAVENOUS | Status: AC
  Filled 2023-02-01: qty 100

## 2023-02-01 MED ORDER — SODIUM CHLORIDE 0.9% FLUSH: 3.0000 mL | INTRAVENOUS | Status: DC | PRN

## 2023-02-01 MED ORDER — ROCURONIUM BROMIDE 10 MG/ML (PF) SYRINGE
PREFILLED_SYRINGE | INTRAVENOUS | Status: AC
  Filled 2023-02-01: qty 10

## 2023-02-01 MED ORDER — PROPOFOL 10 MG/ML IV BOLUS
INTRAVENOUS | Status: AC
  Filled 2023-02-01: qty 20

## 2023-02-01 MED ORDER — FENTANYL CITRATE (PF) 250 MCG/5ML IJ SOLN
INTRAMUSCULAR | Status: AC
  Filled 2023-02-01: qty 5

## 2023-02-01 MED ORDER — FENTANYL CITRATE (PF) 100 MCG/2ML IJ SOLN
25.0000 ug | INTRAMUSCULAR | Status: DC | PRN
  Administered 2023-02-01 (×2): 50 ug via INTRAVENOUS

## 2023-02-01 MED ORDER — SENNA 8.6 MG PO TABS: 1.0000 | ORAL_TABLET | Freq: Two times a day (BID) | ORAL | Status: DC

## 2023-02-01 MED ORDER — CEFAZOLIN SODIUM-DEXTROSE 2-4 GM/100ML-% IV SOLN: 2.0000 g | Freq: Three times a day (TID) | INTRAVENOUS | Status: DC

## 2023-02-01 MED ORDER — CHLORHEXIDINE GLUCONATE 0.12 % MT SOLN: 15.0000 mL | Freq: Once | OROMUCOSAL | Status: AC

## 2023-02-01 MED ORDER — SERTRALINE HCL 50 MG PO TABS: 50.0000 mg | ORAL_TABLET | Freq: Every day | ORAL | Status: DC

## 2023-02-01 MED ORDER — MENTHOL 3 MG MT LOZG: 1.0000 | LOZENGE | OROMUCOSAL | Status: DC | PRN

## 2023-02-01 MED ORDER — LACTATED RINGERS IV SOLN: INTRAVENOUS | Status: DC

## 2023-02-01 MED ORDER — LIDOCAINE 2% (20 MG/ML) 5 ML SYRINGE
INTRAMUSCULAR | Status: DC | PRN
  Administered 2023-02-01: 100 mg via INTRAVENOUS
  Administered 2023-02-01: 40 mg via INTRAVENOUS

## 2023-02-01 MED ORDER — MIDAZOLAM HCL 2 MG/2ML IJ SOLN
INTRAMUSCULAR | Status: AC
  Filled 2023-02-01: qty 2

## 2023-02-01 MED ORDER — METHOCARBAMOL 1000 MG/10ML IJ SOLN: 500.0000 mg | Freq: Four times a day (QID) | INTRAVENOUS | Status: DC | PRN

## 2023-02-01 MED ORDER — MIDAZOLAM HCL 2 MG/2ML IJ SOLN
INTRAMUSCULAR | Status: DC | PRN
  Administered 2023-02-01: 2 mg via INTRAVENOUS

## 2023-02-01 MED ORDER — DEXAMETHASONE 4 MG PO TABS
4.0000 mg | ORAL_TABLET | Freq: Four times a day (QID) | ORAL | Status: DC
  Administered 2023-02-01: 4 mg via ORAL
  Filled 2023-02-01: qty 1

## 2023-02-01 MED ORDER — SUGAMMADEX SODIUM 200 MG/2ML IV SOLN
INTRAVENOUS | Status: DC | PRN
  Administered 2023-02-01: 320 mg via INTRAVENOUS

## 2023-02-01 MED ORDER — FENTANYL CITRATE (PF) 100 MCG/2ML IJ SOLN
INTRAMUSCULAR | Status: AC
  Filled 2023-02-01: qty 2

## 2023-02-01 MED ORDER — ONDANSETRON HCL 4 MG/2ML IJ SOLN
INTRAMUSCULAR | Status: DC | PRN
Start: 2023-02-01 — End: 2023-02-01
  Administered 2023-02-01: 4 mg via INTRAVENOUS

## 2023-02-01 MED ORDER — FENTANYL CITRATE (PF) 250 MCG/5ML IJ SOLN
INTRAMUSCULAR | Status: DC | PRN
  Administered 2023-02-01: 50 ug via INTRAVENOUS
  Administered 2023-02-01: 100 ug via INTRAVENOUS
  Administered 2023-02-01: 25 ug via INTRAVENOUS

## 2023-02-01 MED ORDER — KETAMINE HCL 50 MG/5ML IJ SOSY
PREFILLED_SYRINGE | INTRAMUSCULAR | Status: AC
  Filled 2023-02-01: qty 5

## 2023-02-01 MED ORDER — ACETAMINOPHEN 325 MG PO TABS: 650.0000 mg | ORAL_TABLET | ORAL | Status: DC | PRN

## 2023-02-01 MED ORDER — ACETAMINOPHEN 650 MG RE SUPP: 650.0000 mg | RECTAL | Status: DC | PRN

## 2023-02-01 MED ORDER — DEXAMETHASONE SODIUM PHOSPHATE 10 MG/ML IJ SOLN
INTRAMUSCULAR | Status: DC | PRN
  Administered 2023-02-01: 5 mg via INTRAVENOUS

## 2023-02-01 MED ORDER — OXYCODONE HCL 5 MG/5ML PO SOLN: 5.0000 mg | Freq: Once | ORAL | Status: DC | PRN

## 2023-02-01 MED ORDER — ACETAMINOPHEN 500 MG PO TABS: 1000.0000 mg | ORAL_TABLET | Freq: Once | ORAL | Status: AC

## 2023-02-01 MED ORDER — BUPIVACAINE HCL (PF) 0.25 % IJ SOLN
INTRAMUSCULAR | Status: AC
  Filled 2023-02-01: qty 30

## 2023-02-01 MED ORDER — ACETAMINOPHEN 10 MG/ML IV SOLN: 1000.0000 mg | Freq: Once | INTRAVENOUS | Status: DC | PRN

## 2023-02-01 MED ORDER — CEFAZOLIN SODIUM-DEXTROSE 2-4 GM/100ML-% IV SOLN
2.0000 g | INTRAVENOUS | Status: AC
  Administered 2023-02-01: 2 g via INTRAVENOUS

## 2023-02-01 MED ORDER — DEXAMETHASONE SODIUM PHOSPHATE 10 MG/ML IJ SOLN
INTRAMUSCULAR | Status: AC
  Filled 2023-02-01: qty 1

## 2023-02-01 MED ORDER — SCOPOLAMINE 1 MG/3DAYS TD PT72: 1.0000 | MEDICATED_PATCH | TRANSDERMAL | Status: DC

## 2023-02-01 MED ORDER — OXYCODONE HCL 5 MG PO TABS: 5.0000 mg | ORAL_TABLET | Freq: Once | ORAL | Status: DC | PRN

## 2023-02-01 MED ORDER — CEFAZOLIN SODIUM-DEXTROSE 2-4 GM/100ML-% IV SOLN
INTRAVENOUS | Status: AC
  Filled 2023-02-01: qty 100

## 2023-02-01 MED ORDER — THROMBIN 5000 UNITS EX SOLR
OROMUCOSAL | Status: DC | PRN
  Administered 2023-02-01: 5 mL via TOPICAL

## 2023-02-01 MED ORDER — FAMOTIDINE 20 MG PO TABS: 20.0000 mg | ORAL_TABLET | Freq: Every day | ORAL | Status: DC

## 2023-02-01 MED ORDER — THROMBIN 5000 UNITS EX SOLR
CUTANEOUS | Status: AC
  Filled 2023-02-01: qty 5000

## 2023-02-01 MED ORDER — KETAMINE HCL 10 MG/ML IJ SOLN
INTRAMUSCULAR | Status: DC | PRN
  Administered 2023-02-01: 20 mg via INTRAVENOUS
  Administered 2023-02-01: 10 mg via INTRAVENOUS

## 2023-02-01 MED ORDER — ROCURONIUM BROMIDE 10 MG/ML (PF) SYRINGE
PREFILLED_SYRINGE | INTRAVENOUS | Status: DC | PRN
  Administered 2023-02-01: 80 mg via INTRAVENOUS
  Administered 2023-02-01: 10 mg via INTRAVENOUS
  Administered 2023-02-01: 20 mg via INTRAVENOUS

## 2023-02-01 MED ORDER — PANTOPRAZOLE SODIUM 40 MG PO TBEC: 40.0000 mg | DELAYED_RELEASE_TABLET | Freq: Every day | ORAL | Status: DC

## 2023-02-01 MED ORDER — BUPIVACAINE HCL (PF) 0.25 % IJ SOLN
INTRAMUSCULAR | Status: DC | PRN
  Administered 2023-02-01: 6 mL

## 2023-02-01 MED ORDER — METHOCARBAMOL 500 MG PO TABS: 500.0000 mg | ORAL_TABLET | Freq: Four times a day (QID) | ORAL | 0 refills | Status: AC | PRN

## 2023-02-01 MED ORDER — ONDANSETRON HCL 4 MG/2ML IJ SOLN: 4.0000 mg | Freq: Four times a day (QID) | INTRAMUSCULAR | Status: DC | PRN

## 2023-02-01 MED ORDER — PHENOL 1.4 % MT LIQD: 1.0000 | OROMUCOSAL | Status: DC | PRN

## 2023-02-01 MED ORDER — ACETAMINOPHEN 500 MG PO TABS
ORAL_TABLET | ORAL | Status: AC
  Administered 2023-02-01: 1000 mg via ORAL
  Filled 2023-02-01: qty 2

## 2023-02-01 MED ORDER — ONDANSETRON HCL 4 MG PO TABS: 4.0000 mg | ORAL_TABLET | Freq: Four times a day (QID) | ORAL | Status: DC | PRN

## 2023-02-01 MED ORDER — CHLORHEXIDINE GLUCONATE 0.12 % MT SOLN
OROMUCOSAL | Status: AC
  Administered 2023-02-01: 15 mL via OROMUCOSAL
  Filled 2023-02-01: qty 15

## 2023-02-01 MED ORDER — 0.9 % SODIUM CHLORIDE (POUR BTL) OPTIME
TOPICAL | Status: DC | PRN
  Administered 2023-02-01: 1000 mL

## 2023-02-01 MED ORDER — SCOPOLAMINE 1 MG/3DAYS TD PT72
MEDICATED_PATCH | TRANSDERMAL | Status: AC
  Administered 2023-02-01: 1.5 mg via TRANSDERMAL
  Filled 2023-02-01: qty 1

## 2023-02-01 MED ORDER — HYDROCHLOROTHIAZIDE 12.5 MG PO TABS: 12.5000 mg | ORAL_TABLET | Freq: Every day | ORAL | Status: DC

## 2023-02-01 SURGICAL SUPPLY — 52 items
APL SKNCLS STERI-STRIP NONHPOA (GAUZE/BANDAGES/DRESSINGS) ×1
BAG COUNTER SPONGE SURGICOUNT (BAG) ×1 IMPLANT
BAG SPNG CNTER NS LX DISP (BAG) ×1
BASKET BONE COLLECTION (BASKET) IMPLANT
BENZOIN TINCTURE PRP APPL 2/3 (GAUZE/BANDAGES/DRESSINGS) ×1 IMPLANT
BUR CARBIDE MATCH 3.0 (BURR) ×1 IMPLANT
CANISTER SUCT 3000ML PPV (MISCELLANEOUS) ×1 IMPLANT
CNTNR URN SCR LID CUP LEK RST (MISCELLANEOUS) IMPLANT
CONT SPEC 4OZ STRL OR WHT (MISCELLANEOUS) ×1
DRAPE C-ARM 42X72 X-RAY (DRAPES) ×2 IMPLANT
DRAPE INCISE 51X51 W/FILM STRL (DRAPES) IMPLANT
DRAPE LAPAROTOMY 100X72 PEDS (DRAPES) ×1 IMPLANT
DRAPE MICROSCOPE SLANT 54X150 (MISCELLANEOUS) IMPLANT
DRSG OPSITE 4X5.5 SM (GAUZE/BANDAGES/DRESSINGS) IMPLANT
DRSG OPSITE POSTOP 3X4 (GAUZE/BANDAGES/DRESSINGS) IMPLANT
DURAPREP 6ML APPLICATOR 50/CS (WOUND CARE) ×1 IMPLANT
ELECT COATED BLADE 2.86 ST (ELECTRODE) ×1 IMPLANT
ELECT REM PT RETURN 9FT ADLT (ELECTROSURGICAL) ×1
ELECTRODE REM PT RTRN 9FT ADLT (ELECTROSURGICAL) ×1 IMPLANT
GAUZE 4X4 16PLY ~~LOC~~+RFID DBL (SPONGE) IMPLANT
GLOVE BIO SURGEON STRL SZ7 (GLOVE) IMPLANT
GLOVE BIO SURGEON STRL SZ8 (GLOVE) ×1 IMPLANT
GLOVE BIOGEL PI IND STRL 7.0 (GLOVE) IMPLANT
GOWN STRL REUS W/ TWL LRG LVL3 (GOWN DISPOSABLE) IMPLANT
GOWN STRL REUS W/ TWL XL LVL3 (GOWN DISPOSABLE) IMPLANT
GOWN STRL REUS W/TWL 2XL LVL3 (GOWN DISPOSABLE) ×1 IMPLANT
GOWN STRL REUS W/TWL LRG LVL3 (GOWN DISPOSABLE)
GOWN STRL REUS W/TWL XL LVL3 (GOWN DISPOSABLE)
HEMOSTAT POWDER KIT SURGIFOAM (HEMOSTASIS) ×1 IMPLANT
KIT BASIN OR (CUSTOM PROCEDURE TRAY) ×1 IMPLANT
KIT TURNOVER KIT B (KITS) ×1 IMPLANT
NDL HYPO 25X1 1.5 SAFETY (NEEDLE) ×1 IMPLANT
NDL SPNL 20GX3.5 QUINCKE YW (NEEDLE) ×1 IMPLANT
NEEDLE HYPO 25X1 1.5 SAFETY (NEEDLE) ×1 IMPLANT
NEEDLE SPNL 20GX3.5 QUINCKE YW (NEEDLE) ×1 IMPLANT
NS IRRIG 1000ML POUR BTL (IV SOLUTION) ×1 IMPLANT
PACK LAMINECTOMY NEURO (CUSTOM PROCEDURE TRAY) ×1 IMPLANT
PAD ARMBOARD 7.5X6 YLW CONV (MISCELLANEOUS) ×1 IMPLANT
PIN DISTRACTION 14MM (PIN) IMPLANT
PLATE LOCK INSIGNIA 24 1L (Plate) IMPLANT
PUTTY BONE 1CC (Putty) IMPLANT
SCREW VA ST SINGLE LEAD 4X18 (Screw) IMPLANT
SPACER IDENTITI 9X18X16 7D (Spacer) IMPLANT
SPONGE INTESTINAL PEANUT (DISPOSABLE) ×1 IMPLANT
SPONGE SURGIFOAM ABS GEL SZ50 (HEMOSTASIS) IMPLANT
STRIP CLOSURE SKIN 1/2X4 (GAUZE/BANDAGES/DRESSINGS) ×1 IMPLANT
SUT VIC AB 3-0 SH 8-18 (SUTURE) ×1 IMPLANT
SUT VICRYL 4-0 PS2 18IN ABS (SUTURE) IMPLANT
SYR 20ML ECCENTRIC (SYRINGE) IMPLANT
TOWEL GREEN STERILE (TOWEL DISPOSABLE) ×1 IMPLANT
TOWEL GREEN STERILE FF (TOWEL DISPOSABLE) ×1 IMPLANT
WATER STERILE IRR 1000ML POUR (IV SOLUTION) ×1 IMPLANT

## 2023-02-01 NOTE — H&P (Signed)
Subjective:   Patient is a 57 y.o. male admitted for myelopathy. The patient first presented to me with complaints of neck pain and numbness of the arm(s). Onset of symptoms was several months ago. The pain is described as aching and occurs all day. The pain is rated moderate, and is located in the neck and radiates to the shoulders. The symptoms have been progressive. Symptoms are exacerbated by extending head backwards, and are relieved by none.  Previous work up includes MRI of cervical spine, results: spinal stenosis.  Past Medical History:  Diagnosis Date   Anxiety    Arthritis    Borderline hypertension    Dyspnea    Eosinophilic esophagitis    GERD (gastroesophageal reflux disease)    Hyperlipidemia    Hypertension    Pre-diabetes     Past Surgical History:  Procedure Laterality Date   ESOPHAGOGASTRODUODENOSCOPY  07/29/2011   Esophagoscopy with removal of food impaction, biopsies suggested eosinophilic esophagitis   ESOPHAGOGASTRODUODENOSCOPY N/A 06/28/2015   Procedure: ESOPHAGOGASTRODUODENOSCOPY (EGD);  Surgeon: Meryl Dare, MD;  Location: [facility];  Service: Endoscopy;  Laterality: N/A;   KNEE SURGERY Right     Allergies  Allergen Reactions   Simvastatin Other (See Comments)    Joint pain    Social History   Tobacco Use   Smoking status: Former    Current packs/day: 0.00    Types: Cigarettes    Quit date: 2005    Years since quitting: 19.5    Passive exposure: Past   Smokeless tobacco: Never  Substance Use Topics   Alcohol use: Yes    Alcohol/week: 7.0 standard drinks of alcohol    Types: 7 Cans of beer per week    Comment: 1 can of beer a night    Family History  Problem Relation Age of Onset   Cancer Maternal Grandmother    Heart disease Maternal Grandmother    Skin cancer Father    Diabetes Maternal Aunt    Colon cancer Neg Hx    Stomach cancer Neg Hx    Prior to Admission medications   Medication Sig Start Date End Date Taking? Authorizing  Provider  acetaminophen (TYLENOL) 500 MG tablet Take 500 mg by mouth 3 (three) times daily. 10/26/22  Yes [provider]  ammonium lactate (LAC-HYDRIN) 12 % lotion Apply 1 Application topically 2 (two) times daily. 05/05/22  Yes [provider]  atorvastatin (LIPITOR) 80 MG tablet Take 80 mg by mouth at bedtime.   Yes [provider]  famotidine (PEPCID) 20 MG tablet One after supper 06/11/21  Yes Nyoka Cowden, MD  gabapentin (NEURONTIN) 300 MG capsule Take 600 mg by mouth at bedtime.   Yes [provider]  HUMIRA PEN 40 MG/0.8ML PNKT Inject 40 mg into the skin every 14 (fourteen) days. 06/24/15  Yes [provider]  losartan-hydrochlorothiazide (HYZAAR) 50-12.5 MG per tablet Take 1 tablet by mouth at bedtime.   Yes [provider]  meloxicam (MOBIC) 15 MG tablet Take 15 mg by mouth every morning. 10/26/22  Yes [provider]  pantoprazole (PROTONIX) 40 MG tablet Take 1 tablet (40 mg total) by mouth daily. 04/17/17  Yes Iva Boop, MD  sertraline (ZOLOFT) 100 MG tablet Take 50 mg by mouth at bedtime.   Yes [provider]  budesonide-formoterol (SYMBICORT) 80-4.5 MCG/ACT inhaler Take 2 puffs first thing in am and then another 2 puffs about 12 hours later. Patient not taking: Reported on 01/19/2023 05/20/22   [name],  Charlaine Dalton, MD     Review of Systems  Positive ROS: neg  All other systems have been reviewed and were otherwise negative with the exception of those mentioned in the HPI and as above.  Objective: Vital signs in last 24 hours: Temp:  [97.9 F (36.6 C)] 97.9 F (36.6 C) (07/24 0734) Pulse Rate:  [56] 56 (07/24 0734) Resp:  [18] 18 (07/24 0734) BP: (159-180)/(89-102) 161/89 (07/24 0753) SpO2:  [96 %] 96 % (07/24 0734) Weight:  [88.5 kg] 88.5 kg (07/24 0734)  General Appearance: Alert, cooperative, no distress, appears stated age Head: Normocephalic, without obvious abnormality, atraumatic Eyes:  PERRL, conjunctiva/corneas clear, EOM's intact      Neck: Supple, symmetrical, trachea midline, Back: Symmetric, no curvature, ROM normal, no CVA tenderness Lungs:  respirations unlabored Heart: Regular rate and rhythm Abdomen: Soft, non-tender Extremities: Extremities normal, atraumatic, no cyanosis or edema Pulses: 2+ and symmetric all extremities Skin: Skin color, texture, turgor normal, no rashes or lesions  NEUROLOGIC:  Mental status: Alert and oriented x4, no aphasia, good attention span, fund of knowledge and memory  Motor Exam - grossly normal Sensory Exam - grossly normal Reflexes: 3+ Coordination - grossly normal Gait - grossly normal Balance - grossly normal Cranial Nerves: I: smell Not tested  II: visual acuity  OS: nl    OD: nl  II: visual fields Full to confrontation  II: pupils Equal, round, reactive to light  III,VII: ptosis None  III,IV,VI: extraocular muscles  Full ROM  V: mastication Normal  V: facial light touch sensation  Normal  V,VII: corneal reflex  Present  VII: facial muscle function - upper  Normal  VII: facial muscle function - lower Normal  VIII: hearing Not tested  IX: soft palate elevation  Normal  IX,X: gag reflex Present  XI: trapezius strength  5/5  XI: sternocleidomastoid strength 5/5  XI: neck flexion strength  5/5  XII: tongue strength  Normal    Data Review Lab Results  Component Value Date   WBC 7.2 01/23/2023   HGB 15.0 01/23/2023   HCT 45.3 01/23/2023   MCV 86.0 01/23/2023   PLT 209 01/23/2023   Lab Results  Component Value Date   NA 135 01/23/2023   K 4.3 01/23/2023   CL 102 01/23/2023   CO2 24 01/23/2023   BUN 15 01/23/2023   CREATININE 1.01 01/23/2023   GLUCOSE 130 (H) 01/23/2023   No results found for: "INR", "PROTIME"  Assessment:   Cervical neck pain with 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 Estimated body mass index is 31.47 kg/m as calculated from the following:    Height as of this encounter: 5\' 6"  (1.676 m).   Weight as of this encounter: 88.5 kg.  Patient has failed conservative therapy. Planned surgery : ACDF C3-4  Plan:   I explained the condition and procedure to the patient and answered any questions.  Patient wishes to proceed with procedure as planned. Understands risks/ benefits/ and expected or typical outcomes.  Tia Alert 02/01/2023 10:54 AM

## 2023-02-01 NOTE — Discharge Summary (Signed)
Physician Discharge Summary  Patient ID: Dominic Jackson MRN: [id number] DOB/AGE: Jan 23, 1966 57 y.o.  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 The patient tolerated the procedure well and was taken to the recovery room and then to the floor in stable condition. The hospital course was routine. There were no complications. The wound remained clean dry and intact. Pt had appropriate neck soreness. No complaints of arm pain or new N/T/W. The patient remained afebrile with stable vital signs, and tolerated a regular diet. The patient continued to increase activities, and pain was well controlled with oral pain medications.   Consults: None  Significant Diagnostic Studies:  Results for orders placed or performed during the hospital encounter of 02/01/23  Glucose, capillary  Result Value Ref Range   Glucose-Capillary 120 (H) 70 - 99 mg/dL  ABO/Rh  Result Value Ref Range   ABO/RH(D)      A POS Performed at [facility], [address] [place], [place] [address]     DG Cervical Spine 1 View  Result Date: 02/01/2023 CLINICAL DATA:  Elective surgery EXAM: DG CERVICAL SPINE - 1 VIEW COMPARISON:  None Available. FINDINGS: Fluoro time: 10 seconds. Radiation dose: 2.13 MGy A single C-arm fluoroscopic images was obtained intraoperatively and submitted for post operative interpretation. This demonstrates ACDF at C3-C4. No obvious immediate hardware complication. Please see the performing provider's procedural report for further detail. IMPRESSION: Negative cervical spine radiographs. Electronically Signed   By: Feliberto Harts M.D.   On: 02/01/2023 16:55   DG C-Arm 1-60  Min-No Report  Result Date: 02/01/2023 Fluoroscopy was utilized by the requesting physician.  No radiographic interpretation.   DG C-Arm 1-60 Min-No Report  Result Date: 02/01/2023 Fluoroscopy was utilized by the requesting physician.  No radiographic interpretation.    Antibiotics:  Anti-infectives (From admission, onward)    Start     Dose/Rate Route Frequency Ordered Stop   02/01/23 2000  ceFAZolin (ANCEF) IVPB 2g/100 mL premix        2 g 200 mL/hr over 30 Minutes Intravenous Every 8 hours 02/01/23 1443 02/02/23 1159   02/01/23 0731  ceFAZolin (ANCEF) 2-4 GM/100ML-% IVPB       Note to Pharmacy: Isabel Caprice, Destiny: cabinet override      02/01/23 0731 02/01/23 1133   02/01/23 0730  ceFAZolin (ANCEF) IVPB 2g/100 mL premix        2 g 200 mL/hr over 30 Minutes Intravenous On call to O.R. 02/01/23 0728 02/01/23 1120       Discharge Exam: Blood pressure (!) 142/88, pulse (!) 58, temperature 98.1 F (36.7 C), temperature source Oral, resp. rate 18, height 5\' 6"  (1.676 m), weight 88.5 kg, SpO2 90%. Neurologic: Grossly normal Ambulating and voiding incision cdi   Discharge Medications:   Allergies as of 02/01/2023       Reactions   Simvastatin Other (See Comments)   Joint pain        Medication List     STOP taking these medications    meloxicam 15 MG tablet Commonly known as: MOBIC       TAKE these medications    acetaminophen 500 MG tablet Commonly known as: TYLENOL Take 500 mg by mouth 3 (three) times  daily.   ammonium lactate 12 % lotion Commonly known as: LAC-HYDRIN Apply 1 Application topically 2 (two) times daily.   atorvastatin 80 MG tablet Commonly known as: LIPITOR Take 80 mg by mouth at bedtime.   budesonide-formoterol 80-4.5 MCG/ACT inhaler Commonly known as: Symbicort Take 2 puffs first thing in am and then another 2 puffs about 12 hours later.   famotidine 20 MG tablet Commonly known as: Pepcid One after supper   gabapentin 300 MG  capsule Commonly known as: NEURONTIN Take 600 mg by mouth at bedtime.   Humira Pen 40 MG/0.8ML Pnkt pen Generic drug: adalimumab Inject 40 mg into the skin every 14 (fourteen) days.   losartan-hydrochlorothiazide 50-12.5 MG tablet Commonly known as: HYZAAR Take 1 tablet by mouth at bedtime.   methocarbamol 500 MG tablet Commonly known as: ROBAXIN Take 1 tablet (500 mg total) by mouth every 6 (six) hours as needed for muscle spasms.   pantoprazole 40 MG tablet Commonly known as: PROTONIX Take 1 tablet (40 mg total) by mouth daily.   sertraline 100 MG tablet Commonly known as: ZOLOFT Take 50 mg by mouth at bedtime.        Disposition: home   Final Dx: acdf C3-4  Discharge Instructions      Remove dressing in 72 hours   Complete by: As directed    Call MD for:  difficulty breathing, headache or visual disturbances   Complete by: As directed    Call MD for:  hives   Complete by: As directed    Call MD for:  persistant nausea and vomiting   Complete by: As directed    Call MD for:  redness, tenderness, or signs of infection (pain, swelling, redness, odor or green/yellow discharge around incision site)   Complete by: As directed    Call MD for:  severe uncontrolled pain   Complete by: As directed    Call MD for:  temperature >100.4   Complete by: As directed    Diet - low sodium heart healthy   Complete by: As directed    Driving Restrictions   Complete by: As directed    No driving for 2 weeks, no riding in the car for 1 week   Increase activity slowly   Complete by: As directed    Lifting restrictions   Complete by: As directed    No lifting more than 8 lbs          Signed: Tiana Loft Shyah Cadmus 02/01/2023, 5:37 PM

## 2023-02-01 NOTE — Anesthesia Procedure Notes (Addendum)
Procedure Name: Intubation Date/Time: 02/01/2023 11:16 AM  Performed by: Owens Loffler, RNPre-anesthesia Checklist: Patient identified, Emergency Drugs available, Suction available, Patient being monitored and Timeout performed Patient Re-evaluated:Patient Re-evaluated prior to induction Oxygen Delivery Method: Circle system utilized Preoxygenation: Pre-oxygenation with 100% oxygen Induction Type: IV induction Ventilation: Mask ventilation without difficulty Laryngoscope Size: McGraph and 4 Grade View: Grade I Tube type: Oral Tube size: 7.5 mm Number of attempts: 1 Airway Equipment and Method: Stylet and Video-laryngoscopy Placement Confirmation: ETT inserted through vocal cords under direct vision, positive ETCO2, CO2 detector and breath sounds checked- equal and bilateral Secured at: 22 cm Tube secured with: Tape Dental Injury: Teeth and Oropharynx as per pre-operative assessment  Comments: Neutral alignment maintained throughout intubation

## 2023-02-01 NOTE — Anesthesia Postprocedure Evaluation (Signed)
Anesthesia Post Note  Patient: Dominic Jackson  Procedure(s) Performed: CERVICAL THREE-FOUR ANTERIOR CERVICAL DECOMPRESSION/DISCECTOMY FUSION     Patient location during evaluation: PACU Anesthesia Type: General Level of consciousness: sedated Pain management: pain level controlled Vital Signs Assessment: post-procedure vital signs reviewed and stable Respiratory status: spontaneous breathing and respiratory function stable Cardiovascular status: stable Postop Assessment: no apparent nausea or vomiting Anesthetic complications: no  There were no known notable events for this encounter.  Last Vitals:  Vitals:   02/01/23 1415 02/01/23 1430  BP: 135/83 129/80  Pulse: 61 60  Resp: (!) 21 20  Temp: 36.4 C   SpO2: 94% 92%    Last Pain:  Vitals:   02/01/23 1325  TempSrc:   PainSc: 0-No pain                 Avalynne Diver DANIEL

## 2023-02-01 NOTE — Transfer of Care (Cosign Needed)
Immediate Anesthesia Transfer of Care Note  Patient: Dominic Jackson  Procedure(s) Performed: CERVICAL THREE-FOUR ANTERIOR CERVICAL DECOMPRESSION/DISCECTOMY FUSION  Patient Location: PACU  Anesthesia Type:General  Level of Consciousness: awake, alert , and patient cooperative  Airway & Oxygen Therapy: Patient Spontanous Breathing and Patient connected to face mask  Post-op Assessment: Report given to RN and Post -op Vital signs reviewed and stable  Post vital signs: Reviewed and stable  Last Vitals:  Vitals Value Taken Time  BP 141/84 02/01/23 1330  Temp 36.4 C 02/01/23 1325  Pulse 78 02/01/23 1332  Resp 23 02/01/23 1332  SpO2 92 % 02/01/23 1332  Vitals shown include unfiled device data.  Last Pain:  Vitals:   02/01/23 1325  TempSrc:   PainSc: 0-No pain         Complications: There were no known notable events for this encounter.

## 2023-02-01 NOTE — Plan of Care (Signed)

## 2023-02-01 NOTE — Op Note (Signed)
02/01/2023  1:20 PM  PATIENT:  Dominic Jackson  57 y.o. 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 Decompressive anterior cervical discectomy C3-4, 2. Anterior cervical arthrodesis C3-4 utilizing a PTI interbody cage packed with locally harvested morcellized autologous bone graft and DBM putty, 3. Anterior cervical plating C3-4 utilizing a ATEC plate  SURGEON:  Marikay Alar, MD  ASSISTANTS: Verlin Dike, FNP  ANESTHESIA:   General  EBL: 50 ml  Total I/O In: 100 [IV Piggyback:100] Out: -   BLOOD ADMINISTERED: none  DRAINS: none  SPECIMEN:  none  INDICATION FOR PROCEDURE: This patient presented with neck pain and shoulder pain with numbness in his hands. Imaging showed severe spinal stenosis C3-4 with signal change in the spinal cord. The patient tried conservative measures without relief. Pain was debilitating. Recommended ACDF with plating. Patient understood the risks, benefits, and alternatives and potential outcomes and wished to proceed.  PROCEDURE DETAILS: Patient was brought to the operating room placed under general endotracheal anesthesia. Patient was placed in the supine position on the operating room table. The neck was prepped with Duraprep and draped in a sterile fashion.   Three cc of local anesthesia was injected and a transverse incision was made on the right side of the neck.  Dissection was carried down thru the subcutaneous tissue and the platysma was  elevated, opened, and undermined with Metzenbaum scissors.  Dissection was then carried out thru an avascular plane leaving the sternocleidomastoid carotid artery and jugular vein laterally and the trachea and esophagus medially with the assistance of my nurse practitioner. The ventral aspect of the vertebral column was identified and a localizing x-ray was taken. The C3-4  level was identified and all in the room agreed with the level. The longus colli muscles were then elevated and the retractor was placed with the assistance of my nurse practitioner. The annulus was incised and the disc space entered. Discectomy was performed with micro-curettes and pituitary rongeurs. I then used the high-speed drill to drill the endplates down to the level of the posterior longitudinal ligament. The drill shavings were saved in a mucous trap for later arthrodesis. The operating microscope was draped and brought into the field provided additional magnification, illumination and visualization. Discectomy was continued posteriorly thru the disc space. Posterior longitudinal ligament was opened with a nerve hook, and then removed along with disc herniation and osteophytes, decompressing the spinal canal and thecal sac. We then continued to remove osteophytic overgrowth and disc material decompressing the neural foramina and exiting nerve roots bilaterally. The scope was angled up and down to help decompress and undercut the vertebral bodies. Once the decompression was completed we could pass a nerve hook circumferentially to assure adequate decompression in the midline and in the neural foramina. So by both visualization and palpation we felt we had an adequate decompression of the neural elements. We then measured the height of the intravertebral disc space and selected a 9 millimeter PTI interbody cage packed with autograft and DBM putty. It was then gently positioned in the intravertebral disc space(s) and countersunk. I then used a 24 mm ATEC plate and placed 10 mm variable angle screws into the vertebral bodies of each level and locked them into position. The wound was irrigated with bacitracin solution, checked for hemostasis which was established and confirmed. Once meticulous hemostasis was achieved, we then proceeded with closure with the assistance of  my nurse practitioner. The platysma was  closed with interrupted 3-0 undyed Vicryl suture, the subcuticular layer was closed with interrupted 3-0 undyed Vicryl suture. The skin edges were approximated with steristrips. The drapes were removed. A sterile dressing was applied. The patient was then awakened from general anesthesia and transferred to the recovery room in stable condition. At the end of the procedure all sponge, needle and instrument counts were correct.   PLAN OF CARE: Admit for overnight observation  PATIENT DISPOSITION:  PACU - hemodynamically stable.   Delay start of Pharmacological VTE agent (>24hrs) due to surgical blood loss or risk of bleeding:  yes

## 2023-02-01 NOTE — Progress Notes (Signed)
Patient alert and oriented, mae's well, voiding adequate amount of urine, swallowing without difficulty, no c/o pain at time of discharge. Patient discharged home with friend. Script and discharged instructions given to patient. Patient and friend stated understanding of instructions given. Patient has an appointment with Dr. Yetta Barre

## 2023-02-02 ENCOUNTER — Encounter (HOSPITAL_COMMUNITY): Payer: Self-pay | Admitting: Neurological Surgery

## 2023-05-25 ENCOUNTER — Ambulatory Visit: Payer: No Typology Code available for payment source | Admitting: Internal Medicine

## 2023-07-16 NOTE — Progress Notes (Deleted)
 Dominic Jackson, male    DOB: [birth year],    MRN: [id number]   Brief patient profile:  57  yowm   quit smoking 2005 ? 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 ILD.   RA dx 2017 on humira per Dominic Jackson since then- was poorly controlled R Knee pain so increased humira 2022. Gained 10 lb since RA   History of Present Illness  06/11/2021  Pulmonary/ 1st office eval/ Dominic Jackson / [facility]  Chief Complaint  Patient presents with   Consult    Dominic Jackson referred for ILD   Patient would like flu shot today.   Dyspnea:  walking across a parking lot / no problem shopping   Maybe 500 ft and has to stop at slower pace than others Cough: when breath  in with cold exp but also p shopping  Wheezing when supine Sleep: flat bed / 2-3 pillows SABA use: never  Rec You don't have significant copd or the type of pulmonary fibrosis that is the bad type You appear to RA in your lungs but it is relatively mild Make sure you check your oxygen saturation at your highest level of activity to be sure it stays over 90% and keep track of it at least once a week, more often if breathing getting worse, and let me know if losing ground.  We need the actual PFTs that were done - bring them with you  Continue Pantoprazole  (protonix ) 40 mg   Take  30-60 min before first meal of the day and Pepcid  (famotidine )  20 mg after supper until return to office   GERD diet .      12/09/2021  f/u ov/Dominic Jackson office/Dominic Jackson re: ? RA lung dz  maint on no rx  Chief Complaint  Patient presents with   Follow-up    Has a little bit of wheezing since last ov.   Dyspnea:  walking uphill about the same = MMRC1 = can walk nl pace, flat grade, can't hurry or go uphills or steps s sob   Cough: has with pollen every year/ uses flonase ' thinks maybe making him wheeze also this year   Sleeping: no resp cc but gf says wheezing at hs/ bed is flat on side / 2 pillows  SABA use: none  02: none  Covid status: never vax / Jan 2023 infected Rec Try breztri   1-2 pffs every 12 hours as needed to see if breathing/ wheezing improve and if so I can call you in Symbicort  160 thru the [facility] Work on inhaler technique:    Return to the Dominic Jackson office next available for PFTs and visit with me on the same day but don't use your breztri  that day   02/15/2022  f/u ov/Dominic Jackson re: AB / RA   maint on symb 160 2bid  none on ay of ov with main finding ERV 18% at wt 200  Chief Complaint  Patient presents with   Follow-up    No new issues and PFT results  Dyspnea: MMRC1 = can walk nl pace, flat grade, can't hurry or go uphills or steps s sob   Cough: some sense of pnds esp in Sleeping: does fine on side,no noct  SABA use: none  02: none  Covid status:   never vax/ one infection Jan 2023  Rec Plan A =  Automatic = Always=   Symbicort  160 Take 2 puffs first thing in am and then another 2 puffs about 12 hours later.  Please schedule a follow up with all medications /inhalers/ solutions in hand      05/20/2022  f/u ov/Dominic Jackson office/Dominic Jackson re: ILD ? RA related  maint on symbicort  160 did not bring meds   Chief Complaint  Patient presents with   Follow-up    Breathing is doing well since last ov.  Brought in some items from Dominic Jackson   Dyspnea:  MMRC1 = can walk nl pace, flat grade, can't hurry or go uphills or steps s sob  / has to walk some with cane due to back pain  Cough: minimal dry cough daytime/ am produces white mucus  Sleeping: on side flat bed/ not aware of drainage  SABA use: none  02: none  Covid status: never / infected Jan 2023  Rec Reduce symbicort  to 80 strength Take 2 puffs first thing in am and then another 2 puffs about 12 hours later.  Work on inhaler technique:   Make sure you check your oxygen saturation at your highest level of activity to be sure it stays over 90%   Please  schedule a follow up visit in 3 months but call sooner if needed - bring inhaler    08/22/2022  f/u ov/Dominic Jackson office/Dominic Jackson re: RA lung dz  maint on symbicort  80 2bid   Chief Complaint  Patient presents with   Follow-up    Feels breathing is bothering him   Dyspnea:  about the same / very active active at work climbing  Cough: variable Sleeping: on side flat bed 2 pillows  SABA use: none  02: none  Covid status: never vaccinated / infected jan 2023  Rec Work on optimal  inhaler technique:   SABRA  Make sure you check your oxygen saturation at your highest level of activity(NOT after you stop)  to be sure it stays over 90%   If you are losing ground with your breathing with exertion or 0xygen saturations are dropping at peak exertion, you need a repeat High Resolution CT chest otherwise can wait until Oct 2024   Please schedule a follow up visit for late Oct 2024  [facility]    07/17/2023  f/u ov/Dominic Jackson office/Dominic Jackson re: *** maint on ***  No chief complaint on file.   Dyspnea:  *** Cough: *** Sleeping: ***   resp cc  SABA use: *** 02: ***  Lung cancer screening: ***   No obvious day to day or daytime variability or assoc excess/ purulent sputum or mucus plugs or hemoptysis or cp or chest tightness, subjective wheeze or overt sinus or hb symptoms.    Also denies any obvious fluctuation of symptoms with weather or environmental changes or other aggravating or alleviating factors except as outlined above   No unusual exposure hx or h/o childhood pna/ asthma or knowledge of premature birth.  Current Allergies, Complete Past Medical History, Past Surgical History, Family History, and Social History were reviewed in Dominic Jackson record.  ROS  The following are not active complaints unless bolded Hoarseness, sore throat, dysphagia, dental problems, itching, sneezing,  nasal congestion or discharge of excess mucus or purulent secretions, ear  ache,   fever, chills, sweats, unintended wt loss or wt gain, classically pleuritic or exertional cp,  orthopnea pnd or arm/hand swelling  or leg swelling, presyncope, palpitations, abdominal pain, anorexia, nausea, vomiting, diarrhea  or change in bowel habits or change in bladder habits, change in stools or change in urine, dysuria, hematuria,  rash, arthralgias, visual complaints, headache, numbness, weakness or ataxia or problems with walking or coordination,  change in mood or  memory.        No outpatient medications have been marked as taking for the 07/17/23 encounter (Appointment) with Dominic Jackson Ozell NOVAK, MD.              Past Medical History:  Diagnosis Date   Arthritis    Borderline hypertension    Eosinophilic esophagitis    GERD (gastroesophageal reflux disease)    Hyperlipidemia    Hypertension         Objective:    Wts  07/17/2023           ***  08/22/2022        196  05/20/2022      195  02/15/2022         200   12/09/21 194 lb 9.6 oz (88.3 kg)  06/11/21 196 lb 1.9 oz (89 kg)  06/04/19 190 lb (86.2 kg)     Vital signs reviewed  07/17/2023  - Note at rest 02 sats  ***% on ***   General appearance:    ***        I personally reviewed images and agree with radiology impression as follows:   Chest HRCT  04/20/22  1. 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   2. Stable dominant 6 mm RLL pulmonary nodule compared to one year  prior and is likely benign however one additional follow-up  thoracic CT suggested in 12 months.     Assessment

## 2023-07-17 ENCOUNTER — Ambulatory Visit: Payer: No Typology Code available for payment source | Admitting: Internal Medicine

## 2023-08-14 ENCOUNTER — Emergency Department (HOSPITAL_COMMUNITY): Payer: No Typology Code available for payment source

## 2023-08-14 ENCOUNTER — Other Ambulatory Visit: Payer: Self-pay

## 2023-08-14 ENCOUNTER — Emergency Department (HOSPITAL_COMMUNITY)
Admission: EM | Admit: 2023-08-14 | Discharge: 2023-08-14 | Disposition: A | Discharge status: Home / Self Care | Payer: No Typology Code available for payment source | Attending: Emergency Medicine | Admitting: Emergency Medicine

## 2023-08-14 DIAGNOSIS — R3129 Other microscopic hematuria: Secondary | ICD-10-CM | POA: Insufficient documentation

## 2023-08-14 DIAGNOSIS — Z794 Long term (current) use of insulin: Secondary | ICD-10-CM | POA: Diagnosis not present

## 2023-08-14 DIAGNOSIS — N200 Calculus of kidney: Secondary | ICD-10-CM | POA: Diagnosis not present

## 2023-08-14 DIAGNOSIS — R739 Hyperglycemia, unspecified: Secondary | ICD-10-CM | POA: Insufficient documentation

## 2023-08-14 DIAGNOSIS — R109 Unspecified abdominal pain: Secondary | ICD-10-CM

## 2023-08-14 DIAGNOSIS — I10 Essential (primary) hypertension: Secondary | ICD-10-CM | POA: Diagnosis not present

## 2023-08-14 LAB — COMPREHENSIVE METABOLIC PANEL
ALT: 33 U/L (ref 0–44)
AST: 32 U/L (ref 15–41)
Albumin: 4 g/dL (ref 3.5–5.0)
Alkaline Phosphatase: 58 U/L (ref 38–126)
Anion gap: 8 (ref 5–15)
BUN: 14 mg/dL (ref 6–20)
CO2: 27 mmol/L (ref 22–32)
Calcium: 8.5 mg/dL — ABNORMAL LOW (ref 8.9–10.3)
Chloride: 101 mmol/L (ref 98–111)
Creatinine, Ser: 1.06 mg/dL (ref 0.61–1.24)
GFR, Estimated: >60 mL/min (ref >60)
Glucose, Bld: 128 mg/dL — ABNORMAL HIGH (ref 70–99)
Potassium: 3.7 mmol/L (ref 3.5–5.1)
Sodium: 136 mmol/L (ref 135–145)
Total Bilirubin: 0.9 mg/dL (ref 0.0–1.2)
Total Protein: 8.1 g/dL (ref 6.5–8.1)

## 2023-08-14 LAB — CBC WITH DIFFERENTIAL/PLATELET
Abs Immature Granulocytes: 0.03 10*3/uL (ref 0.00–0.07)
Basophils Absolute: 0 10*3/uL (ref 0.0–0.1)
Basophils Relative: 0 %
Eosinophils Absolute: 0.1 10*3/uL (ref 0.0–0.5)
Eosinophils Relative: 2 %
HCT: 42.5 % (ref 39.0–52.0)
Hemoglobin: 14.2 g/dL (ref 13.0–17.0)
Immature Granulocytes: 1 %
Lymphocytes Relative: 15 %
Lymphs Abs: 0.8 10*3/uL (ref 0.7–4.0)
MCH: 29.2 pg (ref 26.0–34.0)
MCHC: 33.4 g/dL (ref 30.0–36.0)
MCV: 87.3 fL (ref 80.0–100.0)
Monocytes Absolute: 1.3 10*3/uL — ABNORMAL HIGH (ref 0.1–1.0)
Monocytes Relative: 24 %
Neutro Abs: 3.2 10*3/uL (ref 1.7–7.7)
Neutrophils Relative %: 58 %
Platelets: 193 10*3/uL (ref 150–400)
RBC: 4.87 MIL/uL (ref 4.22–5.81)
RDW: 12.9 % (ref 11.5–15.5)
WBC: 5.5 10*3/uL (ref 4.0–10.5)
nRBC: 0 % (ref 0.0–0.2)

## 2023-08-14 LAB — URINALYSIS, W/ REFLEX TO CULTURE (INFECTION SUSPECTED)
Bacteria, UA: NONE SEEN
Bilirubin Urine: NEGATIVE
Glucose, UA: NEGATIVE mg/dL
Ketones, ur: NEGATIVE mg/dL
Leukocytes,Ua: NEGATIVE
Nitrite: NEGATIVE
Protein, ur: 30 mg/dL — AB
Specific Gravity, Urine: 1.025 (ref 1.005–1.030)
pH: 5 (ref 5.0–8.0)

## 2023-08-14 LAB — LIPASE, BLOOD: Lipase: 30 U/L (ref 11–51)

## 2023-08-14 MED ORDER — MORPHINE SULFATE (PF) 4 MG/ML IV SOLN
4.0000 mg | Freq: Once | INTRAVENOUS | Status: AC
  Administered 2023-08-14: 4 mg via INTRAVENOUS
  Filled 2023-08-14: qty 1

## 2023-08-14 MED ORDER — ONDANSETRON HCL 4 MG/2ML IJ SOLN
4.0000 mg | Freq: Once | INTRAMUSCULAR | Status: AC
  Administered 2023-08-14: 4 mg via INTRAVENOUS
  Filled 2023-08-14: qty 2

## 2023-08-14 NOTE — ED Triage Notes (Signed)
Patient from home for R flank pain that started 1 week ago and has steadily gotten worse; pain radiates to the lower R abd. Reports nausea and "feeling like he is not urinating like he should". Denies constipation, diarrhea. Patient also reports cough and wheezing x 1 week. Upon arrival to ER, patient is alert and oriented, ambu

## 2023-08-14 NOTE — ED Notes (Signed)
 ED Provider at bedside.

## 2023-08-14 NOTE — ED Provider Notes (Signed)
EMERGENCY DEPARTMENT AT [facility] Provider Note   CSN: [id number] Arrival date & time: 08/14/23  9518     History  Chief Complaint  Patient presents with   Flank Pain    Dominic Jackson is a 58 y.o. male.  The history is provided by the patient.  Flank Pain  He has history of hypertension, hyperlipidemia, rheumatoid arthritis, GERD and comes in complaining of right flank pain for the last week which is now starting to radiate to the right lower abdomen.  There has been some mild nausea but no vomiting.  He denies fever or chills.  He denies urinary urgency or frequency or dysuria, but states he does not feel that he has been urinating as much as he should.  Pain is worse when he gets up after laying down for period of time.  He took a dose of cyclobenzaprine, with no relief.   Home Medications Prior to Admission medications   Medication Sig Start Date End Date Taking? Authorizing Provider  acetaminophen (TYLENOL) 500 MG tablet Take 500 mg by mouth 3 (three) times daily. 10/26/22   [provider]  ammonium lactate (LAC-HYDRIN) 12 % lotion Apply 1 Application topically 2 (two) times daily. 05/05/22   [provider]  atorvastatin (LIPITOR) 80 MG tablet Take 80 mg by mouth at bedtime.    [provider]  budesonide-formoterol (SYMBICORT) 80-4.5 MCG/ACT inhaler Take 2 puffs first thing in am and then another 2 puffs about 12 hours later. Patient not taking: Reported on 01/19/2023 05/20/22   Nyoka Cowden, MD  famotidine (PEPCID) 20 MG tablet One after supper 06/11/21   Nyoka Cowden, MD  gabapentin (NEURONTIN) 300 MG capsule Take 600 mg by mouth at bedtime.    [provider]  HUMIRA PEN 40 MG/0.8ML PNKT Inject 40 mg into the skin every 14 (fourteen) days. 06/24/15   [provider]  losartan-hydrochlorothiazide (HYZAAR) 50-12.5 MG per tablet Take 1 tablet by mouth at bedtime.    [provider]   methocarbamol (ROBAXIN) 500 MG tablet Take 1 tablet (500 mg total) by mouth every 6 (six) hours as needed for muscle spasms. 02/01/23   Meyran, Tiana Loft, NP  pantoprazole (PROTONIX) 40 MG tablet Take 1 tablet (40 mg total) by mouth daily. 04/17/17   Iva Boop, MD  sertraline (ZOLOFT) 100 MG tablet Take 50 mg by mouth at bedtime.    [provider]      Allergies    Simvastatin    Review of Systems   Review of Systems  Genitourinary:  Positive for flank pain.  All other systems reviewed and are negative.   Physical Exam Updated Vital Signs BP (!) 152/97 (BP Location: Right Arm)   Pulse 80   Temp 98.6 F (37 C) (Oral)   Resp 19   Ht 5\' 7"  (1.702 m)   Wt 86.2 kg   SpO2 93%   BMI 29.76 kg/m  Physical Exam Vitals and nursing note reviewed.   58 year old male, resting comfortably and in no acute distress. Vital signs are significant for elevated blood pressure. Oxygen saturation is 93%, which is normal. Head is normocephalic and atraumatic. PERRLA, EOMI. Oropharynx is clear. Neck is nontender and supple. Back is nontender in the midline.  There is mild to moderate right CVA tenderness. Lungs are clear without rales, wheezes, or rhonchi. Chest is nontender. Heart has regular rate and rhythm without murmur. Abdomen is soft, flat, with mild  tenderness in the right mid and lower abdomen.  There is no rebound or guarding. Extremities have no cyanosis or edema. Neurologic: Mental status is normal, cranial nerves are intact, moves all extremities equally.  ED Results / Procedures / Treatments   Labs (all labs ordered are listed, but only abnormal results are displayed) Labs Reviewed  COMPREHENSIVE METABOLIC PANEL - Abnormal; Notable for the following components:      Result Value   Glucose, Bld 128 (*)    Calcium 8.5 (*)    All other components within normal limits  CBC WITH DIFFERENTIAL/PLATELET - Abnormal; Notable for the following components:   Monocytes  Absolute 1.3 (*)    All other components within normal limits  URINALYSIS, W/ REFLEX TO CULTURE (INFECTION SUSPECTED) - Abnormal; Notable for the following components:   Hgb urine dipstick MODERATE (*)    Protein, ur 30 (*)    All other components within normal limits  LIPASE, BLOOD   Radiology CT Renal Stone Study Result Date: 08/14/2023 CLINICAL DATA:  Abdominal/flank pain with stone suspected EXAM: CT ABDOMEN AND PELVIS WITHOUT CONTRAST TECHNIQUE: Multidetector CT imaging of the abdomen and pelvis was performed following the standard protocol without IV contrast. RADIATION DOSE REDUCTION: This exam was performed according to the departmental dose-optimization program which includes automated exposure control, adjustment of the mA and/or kV according to patient size and/or use of iterative reconstruction technique. COMPARISON:  None Available. FINDINGS: Lower chest:  Mild subpleural reticulation at the bases. Hepatobiliary: No focal liver abnormality.No evidence of biliary obstruction or stone. Pancreas: Unremarkable. Spleen: Unremarkable. Adrenals/Urinary Tract: Negative adrenals. No hydronephrosis or ureteral stone. Numerous punctate renal calculi on both sides, greater in number on the right. Unremarkable bladder. Stomach/Bowel: No obstruction. No visible bowel inflammation. Scattered colonic diverticulosis. Vascular/Lymphatic: No acute vascular abnormality. Atheromatous calcification of the aorta and iliacs. No mass or adenopathy. Reproductive:No pathologic findings. Other: No ascites or pneumoperitoneum. Musculoskeletal: No acute abnormalities. Generalized lumbar spine degeneration with scoliosis. IMPRESSION: 1. No acute finding. 2. Small bilateral renal calculi without hydronephrosis. 3. Extensive atheromatous calcification. Electronically Signed   By: Tiburcio Pea M.D.   On: 08/14/2023 06:21    Procedures Procedures    Medications Ordered in ED Medications  morphine (PF) 4 MG/ML  injection 4 mg (has no administration in time range)  ondansetron (ZOFRAN) injection 4 mg (has no administration in time range)    ED Course/ Medical Decision Making/ A&P                                 Medical Decision Making Amount and/or Complexity of Data Reviewed Labs: ordered. Radiology: ordered.  Risk Prescription drug management.   Right flank pain suspicious for renal colic from urolithiasis.  Differential diagnosis also includes, but is not limited to, pyelonephritis, appendicitis, pancreatitis, cholecystitis, diverticulitis.  This is a differential which includes conditions with significant risk for morbidity and complications.  I have reviewed his past records, and see no relevant past visits, no prior abdominal imaging.  I have ordered laboratory workup of CBC, comprehensive metabolic panel, lipase, urinalysis.  I have ordered a renal stone protocol CT scan of abdomen and pelvis.  I have ordered morphine for pain and ondansetron for nausea.  I have reviewed his laboratory tests, and my interpretation is elevated random glucose level which will need to be followed as an outpatient, normal CBC, normal lipase.  Urinalysis significant for microscopic hematuria consistent with kidney  stone.  Renal stone protocol CT scan shows small bilateral renal calculi but no ureteral calculi or hydronephrosis.  I have independently viewed the images, and agree with the radiologist's interpretation.  Incidental finding of extensive atheromatous calcification.  At this point, I wonder if he had passed a kidney stone which could leave some residual hematuria but not be visible on CT scan.  Alternatively, he might have another cause for hematuria.  He needs evaluation by urology.  I am discharging him with instructions to use over-the-counter NSAIDs and acetaminophen as needed for pain, return if symptoms are not being adequately controlled at home.  Follow-up with urology.  Final Clinical Impression(s)  / ED Diagnoses Final diagnoses:  Right flank pain  Microscopic hematuria  Elevated random blood glucose level  Nephrolithiasis    Rx / DC Orders ED Discharge Orders     None         Dione Booze, MD 08/14/23 [phone]

## 2023-08-14 NOTE — Discharge Instructions (Addendum)
Your CT scan shows there are some small stones in the kidney, but are not in a position to be causing pain.  Your urine had some blood in it which could be from a passed kidney stone, but there are other things that can cause blood in the urine.  Please follow-up with the urologist for further evaluation of the blood in the urine.  You may take ibuprofen and/or acetaminophen as needed for pain.  Please be aware that if you combine ibuprofen and acetaminophen, you will get better pain relief and if you take either medication by itself.  Return to the emergency department if pain is not being adequately controlled at home, you start running a fever, or have any other new or concerning symptoms.

## 2023-08-15 NOTE — Progress Notes (Addendum)
 Dominic Jackson, male    DOB: 1966/06/16    MRN: [id number]   Brief patient profile:  58 yowm  quit smoking 2005 ? 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 ILD.   RA dx 2017 on humira per [name] since then- was poorly controlled R Knee pain so increased humira 2022. Gained 10 lb since RA   History of Present Illness  06/11/2021  Pulmonary/ 1st Jackson eval/ Dominic Jackson / [name] [facility]  Chief Complaint  Patient presents with   Consult    [name]   Patient would like flu shot today.   Dyspnea:  walking across a parking lot / no problem shopping   Maybe 500 ft and has to stop at slower pace than others Cough: when breath  in with cold exp but also p shopping  Wheezing when supine Sleep: flat bed / 2-3 pillows SABA use: never  Rec You don't have significant copd or the type of pulmonary fibrosis that is the bad type You appear to RA in your lungs but it is relatively mild Make sure you check your oxygen saturation at your highest level of activity to be sure it stays over 90% and keep track of it at least once a week, more often if breathing getting worse, and let me know if losing ground.  We need the actual PFTs that were done - bring them with you  Continue Pantoprazole  (protonix ) 40 mg   Take  30-60 min before first meal of the day and Pepcid  (famotidine )  20 mg after supper until return to [facility]   GERD diet .      12/09/2021  f/u ov/ Jackson/Dominic Jackson re: ? RA lung dz  maint on no rx  Chief Complaint  Patient presents with   Follow-up    Has a little bit of wheezing since last ov.   Dyspnea:  walking uphill about the same = MMRC1 = can walk nl pace, flat grade, can't hurry or go uphills or steps s sob   Cough: has with pollen every year/ uses flonase ' thinks maybe making him wheeze also this year   Sleeping: no resp cc but gf says wheezing at hs/ bed is flat on side / 2 pillows  SABA use: none  02: none  Covid status: never vax / Jan 2023 infected Rec Try breztri   1-2 pffs every 12 hours as needed to see if breathing/ wheezing improve and if so I can call you in Symbicort  160 thru the [name] Work on inhaler technique:    Return to the [place] [facility] next available for PFTs and visit with me on the same day but don't use your breztri  that day   02/15/2022  f/u ov/Dominic Jackson re: AB / RA   maint on symb 160 2bid  none on ay of ov with main finding ERV 18% at wt 200  Chief Complaint  Patient presents with   Follow-up    No new issues and PFT results  Dyspnea: MMRC1 = can walk nl pace, flat grade, can't hurry or go uphills or steps s sob   Cough: some sense of pnds esp in Sleeping: does fine on side,no noct  SABA use: none  02: none  Covid status:   never vax/ one infection Jan 2023  Rec Plan A = Automatic =  Always=   Symbicort  160 Take 2 puffs first thing in am and then another 2 puffs about 12 hours later.  Please schedule a follow up with all medications /inhalers/ solutions in hand      05/20/2022  f/u ov/Dominic Jackson/Dominic Jackson re: ILD ? RA/AB    maint on symbicort  160 did not bring meds   Chief Complaint  Patient presents with   Follow-up    Breathing is doing well since last ov.  Brought in some items from [name]   Dyspnea:  MMRC1 = can walk nl pace, flat grade, can't hurry or go uphills or steps s sob  / has to walk some with cane due to back pain  Cough: minimal dry cough daytime/ am produces white mucus  Sleeping: on side flat bed/ not aware of drainage  SABA use: none  02: none  Covid status: never / infected Jan 2023  Rec Reduce symbicort  to 80 strength Take 2 puffs first thing in am and then another 2 puffs about 12 hours later.  Work on inhaler technique:   Make sure you check your oxygen saturation at your highest level of activity to be sure it stays over 90%   Please  schedule a follow up visit in 3 months but call sooner if needed - bring inhaler    08/22/2022  f/u ov/Dominic Jackson/Dominic [facility] re: RA lung dz  maint on symbicort  80 2bid   Chief Complaint  Patient presents with   Follow-up    Feels breathing is bothering him   Dyspnea:  about the same / very active active at work climbing  Cough: variable Sleeping: on side flat bed 2 pillows  SABA use: none  02: none  Covid status: never vaccinated / infected jan 2023  Rec Work on optimal  inhaler technique:   SABRA  Make sure you check your oxygen saturation at your highest level of activity(NOT after you stop)  to be sure it stays over 90%   If you are losing ground with your breathing with exertion or 0xygen saturations are dropping at peak exertion, you need a repeat High Resolution CT chest otherwise can wait until Oct 2024   Please schedule a follow up visit for late Oct 2024  after your CT [facility]    08/16/2023  f/u ov/ Jackson/Dominic Jackson re: RA lung dz maint on just saba   Chief Complaint  Patient presents with   Follow-up    Follow up    Dyspnea:  better  on symbicort  80 but muscle cramps so stopped and did not call to request alternative Cough: worse since 08/14/23 min mucoid production  Sleeping: bed is fla 2 pillows wakes up with cough sometimes but not bad enough to use inhaler    SABA use: 4 x daily none on day of ov  02: none   No obvious day to day or daytime variability or assoc excess/ purulent sputum or mucus plugs or hemoptysis or cp or chest tightness, subjective wheeze or overt sinus or hb symptoms.    Also denies any obvious fluctuation of symptoms with weather or environmental changes or other aggravating or alleviating factors except as outlined above   No unusual exposure hx or h/o childhood pna/ asthma or knowledge of premature birth.  Current Allergies, Complete Past Medical History, Past Surgical History, Family History, and Social History were reviewed in  [employer] record.  ROS  The following are not active complaints unless bolded Hoarseness, sore throat,  dysphagia, dental problems, itching, sneezing,  nasal congestion or discharge of excess mucus or purulent secretions, ear ache,   fever, chills, sweats, unintended wt loss or wt gain, classically pleuritic or exertional cp,  orthopnea pnd or arm/hand swelling  or leg swelling, presyncope, palpitations, abdominal pain, anorexia, nausea, vomiting, diarrhea  or change in bowel habits or change in bladder habits, change in stools or change in urine, dysuria, hematuria,  rash, arthralgias improved on biologics but does not know name, visual complaints, headache, numbness, weakness or ataxia or problems with walking or coordination,  change in mood or  memory.        Current Meds  Medication Sig   acetaminophen  (TYLENOL ) 500 MG tablet Take 500 mg by mouth 3 (three) times daily.   albuterol (VENTOLIN HFA) 108 (90 Base) MCG/ACT inhaler Inhale into the lungs.   ammonium lactate (LAC-HYDRIN) 12 % lotion Apply 1 Application topically 2 (two) times daily.   atorvastatin (LIPITOR) 80 MG tablet Take 80 mg by mouth at bedtime.   famotidine  (PEPCID ) 20 MG tablet One after supper   gabapentin  (NEURONTIN ) 300 MG capsule Take 600 mg by mouth at bedtime.   HUMIRA PEN 40 MG/0.8ML PNKT Inject 40 mg into the skin every 14 (fourteen) days.   losartan -hydrochlorothiazide  (HYZAAR) 50-12.5 MG per tablet Take 1 tablet by mouth at bedtime.   methocarbamol  (ROBAXIN ) 500 MG tablet Take 1 tablet (500 mg total) by mouth every 6 (six) hours as needed for muscle spasms.   pantoprazole  (PROTONIX ) 40 MG tablet Take 1 tablet (40 mg total) by mouth daily.   sertraline  (ZOLOFT ) 100 MG tablet Take 50 mg by mouth at bedtime.              Past Medical History:  Diagnosis Date   Arthritis    Borderline hypertension    Eosinophilic esophagitis    GERD (gastroesophageal reflux disease)    Hyperlipidemia     Hypertension         Objective:    Wts  08/16/2023          190  08/22/2022        196  05/20/2022      195  02/15/2022         200   12/09/21 194 lb 9.6 oz (88.3 kg)  06/11/21 196 lb 1.9 oz (89 kg)  06/04/19 190 lb (86.2 kg)     Vital signs reviewed  08/16/2023  - Note at rest 02 sats  89% on RA   General appearance:    amb wm easily confused with details of care    Insp pops/ squeaks  min exp wheeze    HEENT : Oropharynx  clear      Nasal turbinates mild non-specific edema   NECK :  without  apparent JVD/ palpable Nodes/TM    LUNGS: no acc muscle use,  Nl contour chest  insp pops/ squeaks min exp wheeze and no cough on exp   CV:  RRR  no s3 or murmur or increase in P2, and no edema   ABD:  soft and nontender   MS:  Gait nl   ext warm without deformities Or obvious joint restrictions  calf tenderness, cyanosis or clubbing    SKIN: warm and dry without lesions    NEURO:  alert, approp, nl sensorium with  no motor or cerebellar deficits apparent.    Report reiviwed: [facility] CT  12/31 2024  w/o contrast  Slight Progesion of ILD with slt  R Sided vol loss vs 04/20/22    CXR PA and Lateral:   08/16/2023 :    I personally reviewed images and impression is as follows:       Did not go for cxr as rec  Assessment

## 2023-08-16 ENCOUNTER — Telehealth: Payer: Self-pay

## 2023-08-16 ENCOUNTER — Ambulatory Visit (HOSPITAL_COMMUNITY)
Admission: RE | Admit: 2023-08-16 | Discharge: 2023-08-16 | Disposition: A | Discharge status: Home / Self Care | Payer: No Typology Code available for payment source | Source: Ambulatory Visit | Attending: Internal Medicine | Admitting: Internal Medicine

## 2023-08-16 ENCOUNTER — Encounter: Payer: Self-pay | Admitting: Internal Medicine

## 2023-08-16 ENCOUNTER — Ambulatory Visit: Payer: No Typology Code available for payment source | Admitting: Internal Medicine

## 2023-08-16 VITALS — BP 111/68 | HR 78 | Wt 190.4 lb

## 2023-08-16 DIAGNOSIS — J45991 Cough variant asthma: Secondary | ICD-10-CM

## 2023-08-16 DIAGNOSIS — M0510 Rheumatoid lung disease with rheumatoid arthritis of unspecified site: Secondary | ICD-10-CM | POA: Diagnosis not present

## 2023-08-16 MED ORDER — BUDESONIDE-FORMOTEROL FUMARATE 80-4.5 MCG/ACT IN AERO: INHALATION_SPRAY | RESPIRATORY_TRACT | Status: DC

## 2023-08-16 MED ORDER — PREDNISONE 10 MG PO TABS
ORAL_TABLET | ORAL | 0 refills | Status: DC
Start: 2023-08-16 — End: 2023-10-04

## 2023-08-16 MED ORDER — PREDNISONE 10 MG PO TABS
ORAL_TABLET | ORAL | 0 refills | Status: DC
Start: 2023-08-16 — End: 2023-08-16

## 2023-08-16 NOTE — Telephone Encounter (Signed)
 Called and lvm pt for regarding copies of his ct scan report that he drop off . To let him know that we have made a copy of this for his chart. Wanted to know if he want his copies back. Ask pt call us  back to let us  know. Dr wert has the pt's copies.

## 2023-08-16 NOTE — Assessment & Plan Note (Addendum)
 Wheeze resolved on symb 160 but has slight throat irritation/ throat clearing  - 05/20/2022  After extensive coaching inhaler device,  effectiveness =    80% so try symb 80 2bid and continue max gerd rx - 08/16/2023  After extensive coaching inhaler device,  effectiveness =    90% restart symbicort  80 but do 1 qid due to cramps on 2 bid      Each maintenance medication was reviewed in detail including emphasizing most importantly the difference between maintenance and prns and under what circumstances the prns are to be triggered using an action plan format where appropriate.  Total time for H and P, chart review, counseling, reviewing hfa device(s) and generating customized AVS unique to this office visit / same day charting  > 40 min

## 2023-08-16 NOTE — Patient Instructions (Addendum)
 Prednisone  10 mg  x 2 each am until better then 1 daily x 5 days and stop  - if worse again, start back over until we determine the lowest effective dose   Symbicort  80  one puff  four times x daily   Only use your albuterol Proair)as a rescue medication to be used if you can't catch your breath by resting or doing a relaxed purse lip breathing pattern.  - The less you use it, the better it will work when you need it. - Ok to use up to 1-2 puffs  every 4 hours if you must but call for immediate appointment if use goes up over your usual need - Don't leave home without it !!  (think of it like the spare tire for your car)   Need your CT report from October 2024 (now)  and your drug formulary (next visit)  Please remember to go to the  x-ray department  @  [facility] for your tests - we will call you with the results when they are available     Please schedule a follow up office visit in 6 weeks, call sooner if needed with all medications /inhalers/ solutions in hand so we can verify exactly what you are taking. This includes all medications from all doctors and over the counters

## 2023-08-16 NOTE — Assessment & Plan Note (Addendum)
 RA [facility] 2017/ [facility] - [facility] 6 2022 PFTs mild restriction and imparied DLCO on report   FVC  3.33 or 80.4% predicted , nl f/v curve  - CT chest [facility]/14/21 at [facility] Not UIP  Mild bronchiectasis,faint wedge shaped GG changes upper lobes, very minimal fibrosis s honeycombing  - 06/11/2021   Walked on RA x  3  lap(s) =  approx 450 ft @ fast pace, stopped due to end of study with mild sob on 1st lap with lowest 02 sats 96%   - 12/09/2021   Walked on RA  x  3  lap(s) =  approx 450  ft  @ mod pace, stopped due to end of study with lowest 02 sats 91%  And sob on last lap assoc with subjective wheeze - PFT's  02/15/2022   FEV1 2.55 (76 % ) ratio 0.85  p 11 % improvement from saba p 0 prior to study with DLCO  16.30 (63%) FV curve min reversible concavity and ERV 18 at wt 200    -  Chest HRCT  04/20/22  1. 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  For now therefore I recommend the patient maintain  20 as a ceiling and 0 as a floor)  Resume symb 80 (see cough variant asthma)  F/u 6 weeks with all meds in hand using a trust but verify approach to confirm accurate Medication  Reconciliation The principal here is that until we are certain that the  patients are doing what we've asked, it makes no sense to ask them to do more.

## 2023-08-23 ENCOUNTER — Encounter: Payer: Self-pay | Admitting: Internal Medicine

## 2023-08-23 MED ORDER — BUDESONIDE-FORMOTEROL FUMARATE 80-4.5 MCG/ACT IN AERO: INHALATION_SPRAY | RESPIRATORY_TRACT | 12 refills | Status: AC

## 2023-08-24 ENCOUNTER — Telehealth: Payer: Self-pay | Admitting: Internal Medicine

## 2023-08-24 NOTE — Telephone Encounter (Signed)
Per LOV 08/16/23:  Symbicort 80  one puff  four times x daily   Spoke with Teasia and confirmed dosing instructions. Nothing further needed at this time.

## 2023-08-24 NOTE — Telephone Encounter (Signed)
Dominic Jackson needs clarification of directions for Symbicort. Dominic Jackson phone number is [phone].

## 2023-10-01 NOTE — Progress Notes (Unsigned)
 Dominic Jackson, male    DOB: March 06, 1966    MRN: [id number]   Brief patient profile:  58 yowm  quit smoking 2005 ? 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 ILD.   RA dx 2017 on humira per [name] since then- was poorly controlled R Knee pain so increased humira 2022. Gained 10 lb since RA   History of Present Illness  06/11/2021  Pulmonary/ 1st office eval/ [name] / [place] [facility]  Chief Complaint  Patient presents with   Consult    [facility]   Patient would like flu shot today.   Dyspnea:  walking across a parking lot / no problem shopping   Maybe 500 ft and has to stop at slower pace than others Cough: when breath  in with cold exp but also p shopping  Wheezing when supine Sleep: flat bed / 2-3 pillows SABA use: never  Rec You don't have significant copd or the type of pulmonary fibrosis that is the bad type You appear to RA in your lungs but it is relatively mild Make sure you check your oxygen saturation at your highest level of activity to be sure it stays over 90% and keep track of it at least once a week, more often if breathing getting worse, and let me know if losing ground.  We need the actual PFTs that were done - bring them with you  Continue Pantoprazole (protonix) 40 mg   Take  30-60 min before first meal of the day and Pepcid (famotidine)  20 mg after supper until return to office   GERD diet .      12/09/2021  f/u ov/[place] office/Etna Forquer re: ? RA lung dz  maint on no rx  Chief Complaint  Patient presents with   Follow-up    Has a little bit of wheezing since last ov.   Dyspnea:  walking uphill about the same = MMRC1 = can walk nl pace, flat grade, can't hurry or go uphills or steps s sob   Cough: has with pollen every year/ uses flonase ' thinks maybe making him wheeze also this year   Sleeping: no resp cc but gf says wheezing at hs/ bed is flat on side / 2 pillows  SABA use: none  02: none  Covid status: never vax / Jan 2023 infected Rec Try breztri  1-2 pffs every 12 hours as needed to see if breathing/ wheezing improve and if so I can call you in Symbicort 160 thru the [name] Work on inhaler technique:    Return to the [name] next available for PFTs and visit with me on the same day but don't use your breztri that day   02/15/2022  f/u ov/Keidra Withers re: AB / RA   maint on symb 160 2bid  none on ay of ov with main finding ERV 18% at wt 200  Chief Complaint  Patient presents with   Follow-up    No new issues and PFT results  Dyspnea: MMRC1 = can walk nl pace, flat grade, can't hurry or go uphills or steps s sob   Cough: some sense of pnds esp in Sleeping: does fine on side,no noct  SABA use: none  02: none  Covid status:   never vax/ one infection Jan 2023  Rec Plan A = Automatic =  Always=   Symbicort 160 Take 2 puffs first thing in am and then another 2 puffs about 12 hours later.  Please schedule a follow up with all medications /inhalers/ solutions in hand      05/20/2022  f/u ov/[place] office/Dominic Jackson re: ILD ? RA/AB    maint on symbicort 160 did not bring meds   Chief Complaint  Patient presents with   Follow-up    Breathing is doing well since last ov.  Brought in some items from [name]   Dyspnea:  MMRC1 = can walk nl pace, flat grade, can't hurry or go uphills or steps s sob  / has to walk some with cane due to back pain  Cough: minimal dry cough daytime/ am produces white mucus  Sleeping: on side flat bed/ not aware of drainage  SABA use: none  02: none  Covid status: never / infected Jan 2023  Rec Reduce symbicort to 80 strength Take 2 puffs first thing in am and then another 2 puffs about 12 hours later.  Work on inhaler technique:   Make sure you check your oxygen saturation at your highest level of activity to be sure it stays over 90%   Please  schedule a follow up visit in 3 months but call sooner if needed - bring inhaler    08/22/2022  f/u ov/[place] office/[name] re: RA lung dz  maint on symbicort 80 2bid   Chief Complaint  Patient presents with   Follow-up    Feels breathing is bothering him   Dyspnea:  about the same / very active active at work climbing  Cough: variable Sleeping: on side flat bed 2 pillows  SABA use: none  02: none  Covid status: never vaccinated / infected jan 2023  Rec Work on optimal  inhaler technique:   Marland Kitchen  Make sure you check your oxygen saturation at your highest level of activity(NOT after you stop)  to be sure it stays over 90%   If you are losing ground with your breathing with exertion or 0xygen saturations are dropping at peak exertion, you need a repeat High Resolution CT chest otherwise can wait until Oct 2024   Please schedule a follow up visit for late Oct 2024  after your CT chest at [facility]    08/16/2023  f/u ov/[place] office/Dominic Jackson re: RA lung dz maint on just saba   Chief Complaint  Patient presents with   Follow-up    Follow up    Dyspnea:  better  on symbicort 80 but muscle cramps so stopped and did not call to request alternative Cough: worse since 08/14/23 min mucoid production  Sleeping: bed is fla 2 pillows wakes up with cough sometimes but not bad enough to use inhaler    SABA use: 4 x daily none on day of ov  02: none Rec     10/04/2023  f/u ov/[place] office/Dominic Jackson re: *** maint on ***  No chief complaint on file.   Dyspnea:  *** Cough: *** Sleeping: ***   resp cc  SABA use: *** 02: ***  Lung cancer screening: ***   No obvious day to day or daytime variability or assoc excess/ purulent sputum or mucus plugs or hemoptysis or cp or chest tightness, subjective wheeze or overt sinus or hb symptoms.    Also denies any obvious fluctuation of symptoms with weather or environmental changes or other aggravating or alleviating factors except as outlined above   No  unusual exposure hx or h/o  childhood pna/ asthma or knowledge of premature birth.  Current Allergies, Complete Past Medical History, Past Surgical History, Family History, and Social History were reviewed in [employer] record.  ROS  The following are not active complaints unless bolded Hoarseness, sore throat, dysphagia, dental problems, itching, sneezing,  nasal congestion or discharge of excess mucus or purulent secretions, ear ache,   fever, chills, sweats, unintended wt loss or wt gain, classically pleuritic or exertional cp,  orthopnea pnd or arm/hand swelling  or leg swelling, presyncope, palpitations, abdominal pain, anorexia, nausea, vomiting, diarrhea  or change in bowel habits or change in bladder habits, change in stools or change in urine, dysuria, hematuria,  rash, arthralgias, visual complaints, headache, numbness, weakness or ataxia or problems with walking or coordination,  change in mood or  memory.        No outpatient medications have been marked as taking for the 10/04/23 encounter (Appointment) with Nyoka Cowden, MD.              Past Medical History:  Diagnosis Date   Arthritis    Borderline hypertension    Eosinophilic esophagitis    GERD (gastroesophageal reflux disease)    Hyperlipidemia    Hypertension         Objective:    Wts  10/04/2023         ***  08/16/2023          190  08/22/2022        196  05/20/2022      195  02/15/2022         200   12/09/21 194 lb 9.6 oz (88.3 kg)  06/11/21 196 lb 1.9 oz (89 kg)  06/04/19 190 lb (86.2 kg)     Vital signs reviewed  10/04/2023  - Note at rest 02 sats  ***% on ***   General appearance:    ***      Insp pops/ squeaks  min exp wheeze ***     Report reiviwed: [facility] CT  12/31 2024  w/o contrast  Slight Progesion of ILD with slt R Sided vol loss vs 04/20/22    CXR PA and Lateral:   08/16/2023 :    I personally reviewed images and impression is as follows:       Did not go for cxr as  rec  Assessment

## 2023-10-04 ENCOUNTER — Encounter: Payer: Self-pay | Admitting: Internal Medicine

## 2023-10-04 ENCOUNTER — Ambulatory Visit: Payer: No Typology Code available for payment source | Admitting: Internal Medicine

## 2023-10-04 VITALS — BP 152/88 | HR 57 | Ht 67.0 in | Wt 187.8 lb

## 2023-10-04 DIAGNOSIS — M0510 Rheumatoid lung disease with rheumatoid arthritis of unspecified site: Secondary | ICD-10-CM | POA: Diagnosis not present

## 2023-10-04 NOTE — Patient Instructions (Signed)
 Make sure you check your oxygen saturation at your highest level of activity(NOT after you stop)  to be sure it stays over 90% and keep track of it at least once a week, more often if breathing getting worse, and let me know if losing ground. (Collect the dots to connect the dots approach)     Please schedule a follow up visit in 3 months but call sooner if needed  with all medications /inhalers/ solutions in hand so we can verify exactly what you are taking. This includes all medications from all doctors and over the counters

## 2023-10-05 NOTE — Assessment & Plan Note (Signed)
 RA dx 2017/ [facility] - Sept 6 2022 PFTs mild restriction and imparied DLCO" on report   FVC  3.33 or 80.4% predicted , nl f/v curve  - CT chest [facility]/14/21 [facility] Not UIP  Mild bronchiectasis,faint wedge shaped GG changes upper lobes, very minimal fibrosis s honeycombing  - 06/11/2021   Walked on RA x  3  lap(s) =  approx 450 ft @ fast pace, stopped due to end of study with mild sob on 1st lap with lowest 02 sats 96%   - 12/09/2021   Walked on RA  x  3  lap(s) =  approx 450  ft  @ mod pace, stopped due to end of study with lowest 02 sats 91%  And sob on last lap assoc with subjective wheeze - PFT's  02/15/2022   FEV1 2.55 (76 % ) ratio 0.85  p 11 % improvement from saba p 0 prior to study with DLCO  16.30 (63%) FV curve min reversible concavity and ERV 18 at wt 200    -  Chest HRCT  04/20/22  1. 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  - 05/20/2022   Walked on RA  x  3  lap(s) =  approx 450  ft  @ fast pace, stopped due to end of study with lowest 02 sats 93% and some sob on laps 2 and 3   -[facility] CT  12/31 2024  w/o contrast Slight Progesion of ILD with slt R Sided vol loss vs 04/20/22  - 08/22/2022   Walked on RA  x  3  lap(s) =  approx 450  ft  @ fastpace, stopped due to end of study  with lowest 02 sats 91%  with sob on laps 2 and 3  - 10/04/2023   Walked on RA  x  3  lap(s) =  approx 450  ft  @ mod fast pace, stopped due to end of study  with lowest 02 sats 92% and mild sob on 2nd lap   No def clinical progression with the key to his lung disease dependent on the systemic control of his dz and best way to follow is:  Make sure you check your oxygen saturation at your highest level of activity(NOT after you stop)  to be sure it stays over 90% and keep track of it at least once a week, more often if breathing getting worse, and let me  know if losing ground. (Collect the dots to connect the dots approach)    F/u  q 3 m, sooner if needed

## 2024-01-07 NOTE — Progress Notes (Unsigned)
 Dominic Jackson, male    DOB: [birth year]    MRN: [id number]   Brief patient profile:  58 yowm  quit smoking 2005 ? 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 ILD.   RA dx 2017 on humira per [name] since then- was poorly controlled R Knee pain so increased humira 2022. Gained 10 lb since RA   History of Present Illness  06/11/2021  Pulmonary/ 1st office eval/ Dominic / [place] [facility]  Chief Complaint  Patient presents with   Consult    [name]   Patient would like flu shot today.   Dyspnea:  walking across a parking lot / no problem shopping   Maybe 500 ft and has to stop at slower pace than others Cough: when breath  in with cold exp but also p shopping  Wheezing when supine Sleep: flat bed / 2-3 pillows SABA use: never  Rec You don't have significant copd or the type of pulmonary fibrosis that is the bad type You appear to RA in your lungs but it is relatively mild Make sure you check your oxygen saturation at your highest level of activity to be sure it stays over 90% and keep track of it at least once a week, more often if breathing getting worse, and let me know if losing ground.  We need the actual PFTs that were done - bring them with you  Continue Pantoprazole  (protonix ) 40 mg   Take  30-60 min before first meal of the day and Pepcid  (famotidine )  20 mg after supper until return to office   GERD diet .      12/09/2021  f/u ov/[place] office/[name] re: ? RA lung dz  maint on no rx  Chief Complaint  Patient presents with   Follow-up    Has a little bit of wheezing since last ov.   Dyspnea:  walking uphill about the same = MMRC1 = can walk nl pace, flat grade, can't hurry or go uphills or steps s sob   Cough: has with pollen every year/ uses flonase ' thinks maybe making him wheeze also this year   Sleeping: no resp cc but gf says wheezing at hs/ bed is flat on side / 2 pillows  SABA use: none  02: none  Covid status: never vax / Jan 2023 infected Rec Try breztri   1-2 pffs every 12 hours as needed to see if breathing/ wheezing improve and if so I can call you in Symbicort  [facility] Work on inhaler technique:    Return to the [name] next available for PFTs and visit with me on the same day but don't use your breztri  that day     Please schedule a follow up visit for late Oct 2024  after your CT chest [facility]    08/16/2023  f/u ov/ office/[name] re: RA lung dz maint on just saba   Chief Complaint  Patient presents with   Follow-up    Follow up   Dyspnea:  better  on symbicort  80 but muscle cramps so stopped and did not call to request alternative Cough: worse since 08/14/23 min mucoid production  Sleeping: bed is flat 2 pillows wakes up with cough sometimes but not bad enough to use inhaler    SABA use: 4 x daily  none on day of ov  02: none Rec Prednisone  10 mg  x 2 each am until better then 1 daily x 5 days and stop  - if worse again, start back over until we determine the lowest effective dose Symbicort  80  one puff  four times x daily  Only use your albuterol Proair)as a rescue medication Please schedule a follow up office visit in 6 weeks, call sooner if needed with all medications /inhalers/ solutions in hand    10/04/2023  f/u ov/[place] office/[name] re: RA lung dz / AB maint on symb 80  1 puff qid [facility]  did not bring meds as requested  Chief Complaint  Patient presents with   Follow-up    Follow up for cough , he stated that cough has gotten better it still, has some cough with mucus   Dyspnea:  mainly hills bother him - does fine flat at nl pace  Cough: minimal / some assoc nasal congestion on flonase Sleeping: flat bed 2 pillows s    resp cc  SABA use: rarely 02: none  Rec    01/08/2024  f/u ov/[place] office/[name] re: RA lung  dz/ AB  maint on ***  No chief complaint on file.   Dyspnea:  *** Cough: *** Sleeping: ***   resp cc  SABA use: *** 02: ***  Lung cancer screening: ***   No obvious day to day or daytime variability or assoc excess/ purulent sputum or mucus plugs or hemoptysis or cp or chest tightness, subjective wheeze or overt sinus or hb symptoms.    Also denies any obvious fluctuation of symptoms with weather or environmental changes or other aggravating or alleviating factors except as outlined above   No unusual exposure hx or h/o childhood pna/ asthma or knowledge of premature birth.  Current Allergies, Complete Past Medical History, Past Surgical History, Family History, and Social History were reviewed in [employer] record.  ROS  The following are not active complaints unless bolded Hoarseness, sore throat, dysphagia, dental problems, itching, sneezing,  nasal congestion or discharge of excess mucus or purulent secretions, ear ache,   fever, chills, sweats, unintended wt loss or wt gain, classically pleuritic or exertional cp,  orthopnea pnd or arm/hand swelling  or leg swelling, presyncope, palpitations, abdominal pain, anorexia, nausea, vomiting, diarrhea  or change in bowel habits or change in bladder habits, change in stools or change in urine, dysuria, hematuria,  rash, arthralgias, visual complaints, headache, numbness, weakness or ataxia or problems with walking or coordination,  change in mood or  memory.        No outpatient medications have been marked as taking for the 01/08/24 encounter (Appointment) with Dominic Ozell NOVAK, MD.             Past Medical History:  Diagnosis Date   Arthritis    Borderline hypertension    Eosinophilic esophagitis    GERD (gastroesophageal reflux disease)    Hyperlipidemia    Hypertension         Objective:    Wts  01/08/2024         ***  10/04/2023        187  08/16/2023          190  08/22/2022        196  05/20/2022       195  02/15/2022         200   12/09/21 194 lb 9.6 oz (88.3  kg)  06/11/21 196 lb 1.9 oz (89 kg)  06/04/19 190 lb (86.2 kg)     Vital signs reviewed  01/08/2024  - Note at rest 02 sats  ***% on ***   General appearance:    ***       Report   [facility] CT  12/31 2024  w/o contrast  Slight Progesion of ILD with slt R Sided vol loss vs 04/20/22     Assessment

## 2024-01-08 ENCOUNTER — Encounter: Payer: Self-pay | Admitting: Internal Medicine

## 2024-01-08 ENCOUNTER — Ambulatory Visit (INDEPENDENT_AMBULATORY_CARE_PROVIDER_SITE_OTHER): Admitting: Internal Medicine

## 2024-01-08 VITALS — BP 150/83 | HR 70 | Ht 67.0 in | Wt 189.2 lb

## 2024-01-08 DIAGNOSIS — Z87891 Personal history of nicotine dependence: Secondary | ICD-10-CM | POA: Diagnosis not present

## 2024-01-08 DIAGNOSIS — J45991 Cough variant asthma: Secondary | ICD-10-CM | POA: Diagnosis not present

## 2024-01-08 DIAGNOSIS — M0510 Rheumatoid lung disease with rheumatoid arthritis of unspecified site: Secondary | ICD-10-CM

## 2024-01-08 MED ORDER — GABAPENTIN 100 MG PO CAPS: 100.0000 mg | ORAL_CAPSULE | Freq: Every morning | ORAL | 2 refills | Status: AC

## 2024-01-08 NOTE — Assessment & Plan Note (Signed)
 Wheeze resolved on symb 160 but has slight throat irritation/ throat clearing  - 05/20/2022  After extensive coaching inhaler device,  effectiveness =    80% so try symb 80 2bid and continue max gerd rx - 08/16/2023  After extensive coaching inhaler device,  effectiveness =    90% restart symbicort  80 but do 1 qid due to cramps on 2 bid > resolved as of 01/08/2024 > no change in symbicort  80 qid   >>>   01/08/2024 added gabapentin  100 mg q am to the 300 at bedtime he's already taking to see if throat clearling improves (plus hard rock candy) plus max gerd rx.

## 2024-01-08 NOTE — Patient Instructions (Addendum)
 Gabapentin  100 mg add one each am to see if it helps your throat clearing   Use jolly ranchers to suppress your urge to clear throat.   Please schedule a follow up visit in 3 months but call sooner if needed - bring all meds with you.

## 2024-01-09 ENCOUNTER — Encounter: Payer: Self-pay | Admitting: Internal Medicine

## 2024-01-09 NOTE — Assessment & Plan Note (Addendum)
 RA dx 2017/ [facility] - Sept 6 2022 PFTs mild restriction and imparied DLCO on report   FVC  3.33 or 80.4% predicted , nl f/v curve  - CT chest 04/14/21 at [facility] Not UIP  Mild bronchiectasis,faint wedge shaped GG changes upper lobes, very minimal fibrosis s honeycombing  - 06/11/2021   Walked on RA x  3  lap(s) =  approx 450 ft @ fast pace, stopped due to end of study with mild sob on 1st lap with lowest 02 sats 96%   - 12/09/2021   Walked on RA  x  3  lap(s) =  approx 450  ft  @ mod pace, stopped due to end of study with lowest 02 sats 91%  And sob on last lap assoc with subjective wheeze - PFT's  02/15/2022   FEV1 2.55 (76 % ) ratio 0.85  p 11 % improvement from saba p 0 prior to study with DLCO  16.30 (63%) FV curve min reversible concavity and ERV 18 at wt 200    -  Chest HRCT  04/20/22  1. 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  - 05/20/2022   Walked on RA  x  3  lap(s) =  approx 450  ft  @ fast pace, stopped due to end of study with lowest 02 sats 93% and some sob on laps 2 and 3   -[facility] CT  12/31 2024  w/o contrast Slight Progesion of ILD with slt R Sided vol loss vs 04/20/22  - 08/22/2022   Walked on RA  x  3  lap(s) =  approx 450  ft  @ fastpace, stopped due to end of study  with lowest 02 sats 91%  with sob on laps 2 and 3  - 10/04/2023   Walked on RA  x  3  lap(s) =  approx 450  ft  @ mod fast pace, stopped due to end of study  with lowest 02 sats 92% and mild sob on 2nd lap  -01/08/2024   Walked on RA  x  3  lap(s) =  approx 450  ft  @ mod  pace, stopped due to end of study  with lowest 02 sats 94%    No evidence of ild progression > f/u yearly but in meantime advised:  Make sure you check your oxygen saturation at your highest level of activity(NOT after you stop)  to be sure it stays over 90% and keep track of it at least  once a week, more often if breathing getting worse, and let me know if losing ground. (Collect the dots to connect the dots approach)    Discussed in detail all the  indications, usual  risks and alternatives  relative to the benefits with patient who agrees to proceed with f/u as outlined and yearly ov  Each maintenance medication was reviewed in detail including emphasizing most importantly the difference between maintenance and prns and under what circumstances the prns are to be triggered using an action plan format where appropriate.  Total time for H and P, chart review, counseling,  directly observing portions of ambulatory 02 saturation study/ and generating customized AVS unique to this office visit / same day charting = 34 min

## 2024-04-08 NOTE — Progress Notes (Deleted)
 Dominic Jackson, male    DOB: 12-22-1965    MRN: [id number]   Brief patient profile:  58 yowm  quit smoking 2005 ? 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 ILD.   RA dx 2017 on humira per [name] since then- was poorly controlled R Knee pain so increased humira 2022. Gained 10 lb since RA   History of Present Illness  06/11/2021  Pulmonary/ 1st office eval/ [name] / [name]  Chief Complaint  Patient presents with   Consult    [name]   Patient would like flu shot today.   Dyspnea:  walking across a parking lot / no problem shopping   Maybe 500 ft and has to stop at slower pace than others Cough: when breath  in with cold exp but also p shopping  Wheezing when supine Sleep: flat bed / 2-3 pillows SABA use: never  Rec You don't have significant copd or the type of pulmonary fibrosis that is the bad type You appear to RA in your lungs but it is relatively mild Make sure you check your oxygen saturation at your highest level of activity to be sure it stays over 90% and keep track of it at least once a week, more often if breathing getting worse, and let me know if losing ground.  We need the actual PFTs that were done - bring them with you  Continue Pantoprazole  (protonix ) 40 mg   Take  30-60 min before first meal of the day and Pepcid  (famotidine )  20 mg after supper until return to office   GERD diet .      12/09/2021  f/u ov/[name]/[name] re: ? RA lung dz  maint on no rx  Chief Complaint  Patient presents with   Follow-up    Has a little bit of wheezing since last ov.   Dyspnea:  walking uphill about the same = MMRC1 = can walk nl pace, flat grade, can't hurry or go uphills or steps s sob   Cough: has with pollen every year/ uses flonase ' thinks maybe making him wheeze also this year   Sleeping: no resp cc but gf says wheezing at hs/ bed is flat on side / 2 pillows  SABA use: none  02: none  Covid status: never vax / Jan 2023 infected Rec Try breztri   1-2 pffs every 12 hours as needed to see if breathing/ wheezing improve and if so I can call you in Symbicort  160 thru the [name] Work on inhaler technique:    08/16/2023  f/u ov/[name]/[name] re: RA lung dz maint on just saba   Chief Complaint  Patient presents with   Follow-up    Follow up   Dyspnea:  better  on symbicort  80 but muscle cramps so stopped and did not call to request alternative Cough: worse since 08/14/23 min mucoid production  Sleeping: bed is flat 2 pillows wakes up with cough sometimes but not bad enough to use inhaler    SABA use: 4 x daily none on day of ov  02: none Rec Prednisone  10 mg  x 2 each am until better then 1 daily x 5 days and stop  - if worse again, start back over until we determine the lowest effective dose Symbicort  80  one puff  four  times x daily  Only use your albuterol Proair) as a rescue medication Please schedule a follow up office visit in 6 weeks, call sooner if needed with all medications /inhalers/ solutions in hand    10/04/2023  f/u ov/[name]/[name] re: RA lung dz / AB maint on symb 80  1 puff [facility]id [facility]  did not bring meds as requested  Chief Complaint  Patient presents with   Follow-up    Follow up for cough , he stated that cough has gotten better it still, has some cough with mucus   Dyspnea:  mainly hills bother him - does fine flat at nl pace  Cough: minimal / some assoc nasal congestion on flonase Sleeping: flat bed 2 pillows s resp cc  SABA use: rarely 02: none  Rec Make sure you check your oxygen saturation at your highest level of activity(NOT after you stop)  to be sure it stays over 90% and keep track of it at least once a week, more often if breathing getting worse, and let me know if losing ground. (Collect the dots to connect the  dots approach)    Please schedule a follow up visit in 3 months but call sooner if needed  with all medications /inhalers/ solutions in hand   01/08/2024  f/u ov/[name]/[name] re: RA lung dz/ AB  maint on symbicort  80  did not  bring meds / arthritis Chief Complaint  Patient presents with   Follow-up   Cough     with white mucus  Dyspnea:  no change  Cough: p stirring in am assoc with nasal congestion / using less flonase  Sleeping: on side/ two pillows resp cc  SABA use: once a week  02: none Rec Gabapentin  100 mg add one each am to see if it helps your throat clearing  Use jolly ranchers to suppress your urge to clear throat. Please schedule a follow up visit in 3 months but call sooner if needed - bring all meds with you.    04/09/2024  f/u ov/[name] re: RA lung dz/ AB    maint on ***  did *** bring meds No chief complaint on file.   Dyspnea:  *** Cough: *** Sleeping: *** resp cc  SABA use: *** 02: ***  Lung cancer screening :  ***    No obvious day to day or daytime variability or assoc excess/ purulent sputum or mucus plugs or hemoptysis or cp or chest tightness, subjective wheeze or overt sinus or hb symptoms.    Also denies any obvious fluctuation of symptoms with weather or environmental changes or other aggravating or alleviating factors except as outlined above   No unusual exposure hx or h/o childhood pna/ asthma or knowledge of premature birth.  Current Allergies, Complete Past Medical History, Past Surgical History, Family History, and Social History were reviewed in [employer] record.  ROS  The following are not active complaints unless bolded Hoarseness, sore throat, dysphagia, dental problems, itching, sneezing,  nasal congestion or discharge of excess mucus or purulent secretions, ear ache,   fever, chills, sweats, unintended wt loss or wt gain, classically pleuritic or exertional cp,  orthopnea pnd or arm/hand swelling  or leg  swelling, presyncope, palpitations, abdominal pain, anorexia, nausea, vomiting, diarrhea  or change in bowel habits or change in bladder habits, change in stools or change in urine, dysuria, hematuria,  rash, arthralgias, visual complaints, headache, numbness, weakness or ataxia or problems with walking or  coordination,  change in mood or  memory.        No outpatient medications have been marked as taking for the 04/09/24 encounter (Appointment) with [name], MD.            Past Medical History:  Diagnosis Date   Arthritis    Borderline hypertension    Eosinophilic esophagitis    GERD (gastroesophageal reflux disease)    Hyperlipidemia    Hypertension         Objective:    Wts  04/09/2024         ***  01/08/2024        189   10/04/2023        187  08/16/2023          190  08/22/2022        196  05/20/2022      195  02/15/2022         200   12/09/21 194 lb 9.6 oz (88.3 kg)  06/11/21 196 lb 1.9 oz (89 kg)  06/04/19 190 lb (86.2 kg)      Vital signs reviewed  04/09/2024  - Note at rest 02 sats  ***% on ***   General appearance:    ***         Assessment

## 2024-04-09 ENCOUNTER — Ambulatory Visit: Admitting: Internal Medicine

## 2024-04-09 DIAGNOSIS — J45991 Cough variant asthma: Secondary | ICD-10-CM

## 2024-05-15 ENCOUNTER — Encounter: Payer: Self-pay | Admitting: Internal Medicine

## 2024-05-15 ENCOUNTER — Ambulatory Visit: Admitting: Internal Medicine

## 2024-05-15 ENCOUNTER — Ambulatory Visit (INDEPENDENT_AMBULATORY_CARE_PROVIDER_SITE_OTHER): Admitting: Internal Medicine

## 2024-05-15 VITALS — BP 135/83 | HR 57 | Ht 67.0 in | Wt 190.6 lb

## 2024-05-15 DIAGNOSIS — J45991 Cough variant asthma: Secondary | ICD-10-CM | POA: Diagnosis not present

## 2024-05-15 NOTE — Assessment & Plan Note (Addendum)
 Wheeze resolved on symb 160 but has slight throat irritation/ throat clearing  - 05/20/2022  After extensive coaching inhaler device,  effectiveness =    80% so try symb 80 2bid and continue max gerd rx - 08/16/2023  After extensive coaching inhaler device,  effectiveness =    90% restart symbicort  80 but do 1 qid due to cramps on 2 bid > resolved as of 01/08/2024 > no change in symbicort  80 qid  - 01/08/2024 added gabapentin  100 mg q am to the 300 at bedtime he's already taking to see if throat clearling improves (plus hard rock candy) - 05/15/2024 apparently never took the gabapentin  100 and instead using 300 mg x 2 at hs s cns issues so rec add gabapentin  300 mg in am on days off to see if tolerates and cough better controlled  -  05/15/2024  After extensive coaching inhaler device,  effectiveness =    60% with hfa (short Ti)>  rec continue symb 80 up to qid   Combination of cough variant asthma and UACS hard to manage as the higher doses of ICS cause more cough and the lower doses if used 2bid cause leg cramping so I have customized his rx per AVS  F/u can be yearly but sooner if cough worsens in meantime.      Each maintenance medication was reviewed in detail including emphasizing most importantly the difference between maintenance and prns and under what circumstances the prns are to be triggered using an action plan format where appropriate.  Total time for H and P, chart review, counseling, reviewing hfa device(s) and generating customized AVS unique to this office visit / same day charting = 31 min summary f/u ov

## 2024-05-15 NOTE — Progress Notes (Signed)
 Dominic Jackson, male    DOB: 1965/12/23    MRN: [id number]   Brief patient profile:  58 yowm  quit smoking 2005 ? 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 ILD.   RA dx 2017 on humira per Dominic Jackson since then- was poorly controlled R Knee pain so increased humira 2022. Gained 10 lb since RA   History of Present Illness  06/11/2021  Pulmonary/ 1st office eval/ Dominic [facility] / [facility]  Chief Complaint  Patient presents with   Consult    Dominic Jackson referred for ILD   Patient would like flu shot today.   Dyspnea:  walking across a parking lot / no problem shopping   Maybe 500 ft and has to stop at slower pace than others Cough: when breath  in with cold exp but also p shopping  Wheezing when supine Sleep: flat bed / 2-3 pillows SABA use: never  Rec You don't have significant copd or the type of pulmonary fibrosis that is the bad type You appear to RA in your lungs but it is relatively mild Make sure you check your oxygen saturation at your highest level of activity to be sure it stays over 90% and keep track of it at least once a week, more often if breathing getting worse, and let me know if losing ground.  We need the actual PFTs that were done - bring them with you  Continue Pantoprazole  (protonix ) 40 mg   Take  30-60 min before first meal of the day and Pepcid  (famotidine )  20 mg after supper until return to office   GERD diet .      12/09/2021  f/u ov/[facility]/Dominic Jackson re: ? RA lung dz  maint on no rx  Chief Complaint  Patient presents with   Follow-up    Has a little bit of wheezing since last ov.   Dyspnea:  walking uphill about the same = MMRC1 = can walk nl pace, flat grade, can't hurry or go uphills or steps s sob   Cough: has with pollen every year/ uses flonase ' thinks maybe making him wheeze also this year   Sleeping: no resp cc but gf says wheezing at hs/ bed is flat on side / 2 pillows  SABA use: none  02: none  Covid status: never vax / Jan 2023 infected Rec Try breztri   1-2 pffs every 12 hours as needed to see if breathing/ wheezing improve and if so I can call you in Symbicort  160 thru the [name] Work on inhaler technique:    08/16/2023  f/u ov/ office/Dominic Jackson re: RA lung dz maint on just saba   Chief Complaint  Patient presents with   Follow-up    Follow up   Dyspnea:  better  on symbicort  80 but muscle cramps so stopped and did not call to request alternative Cough: worse since 08/14/23 min mucoid production  Sleeping: bed is flat 2 pillows wakes up with cough sometimes but not bad enough to use inhaler    SABA use: 4 x daily none on day of ov  02: none Rec Prednisone  10 mg  x 2 each am until better then 1 daily x 5 days and stop  - if worse again, start back over until we determine the lowest effective dose Symbicort  80  one puff  four  times x daily  Only use your albuterol Proair)as a rescue medication Please schedule a follow up office visit in 6 weeks, call sooner if needed with all medications /inhalers/ solutions in hand    10/04/2023  f/u ov/[facility]/Dominic Jackson re: RA lung dz / AB maint on symb 80  1 puff qid (due to leg cramps) and humira per Jackson  did not bring meds as requested  Chief Complaint  Patient presents with   Follow-up    Follow up for cough , he stated that cough has gotten better it still, has some cough with mucus   Dyspnea:  mainly hills bother him - does fine flat at nl pace  Cough: minimal / some assoc nasal congestion on flonase Sleeping: flat bed 2 pillows s resp cc  SABA use: rarely 02: none  Rec Make sure you check your oxygen saturation at your highest level of activity(NOT after you stop)  to be sure it stays over 90% and keep track of it at least once a week, more often if breathing getting worse, and let me know if losing ground. (Collect the  dots to connect the dots approach)    Please schedule a follow up visit in 3 months but call sooner if needed  with all medications /inhalers/ solutions in hand  01/08/2024  f/u ov/[facility] office/Dominic Jackson re: RA lung dz/ AB  maint on symbicort  80  did not  bring meds / arthritis Chief Complaint  Patient presents with   Follow-up   Cough     with white mucus  Dyspnea:  no change  Cough: p stirring in am assoc with nasal congestion / using less flonase  Sleeping: on side/ two pillows resp cc  SABA use: once a week  02: none Rec Gabapentin  100 mg add one each am to see if it helps your throat clearing  Use jolly ranchers to suppress your urge to clear throat. Please schedule a follow up visit in 3 months but call sooner if needed - bring all meds with you    05/15/2024  f/u ov/[facility] office/Dominic Jackson re: RA/ AB  maint on hfa did  bring meds - gabapentin  is 300  2 @ bedtime  plus symbicot 80 maybe twice daily and  max gerd rx  Chief Complaint  Patient presents with   Cough    F/u    Dyspnea:  mainly problem with hills avg 2 puffs  Cough: white thick mucu  Sleeping: flat bed / on side / 2 pillows and once a week  SABA use: 2-3 per week  02: none   No obvious day to day or daytime variability or assoc excess/ purulent sputum or mucus plugs or hemoptysis or cp or chest tightness, subjective wheeze or overt  hb symptoms.    Also denies any obvious fluctuation of symptoms with weather or environmental changes or other aggravating or alleviating factors except as outlined above   No unusual exposure hx or h/o childhood pna/ asthma or knowledge of premature birth.  Current Allergies, Complete Past Medical History, Past Surgical History, Family History, and Social History were reviewed in [employer] [facility] record.  ROS  The following are not active complaints unless bolded Hoarseness, sore throat/globus , dysphagia, dental problems, itching, sneezing,  nasal congestion or  discharge of excess mucus or purulent secretions, ear ache,   fever, chills, sweats, unintended wt loss or wt gain, classically pleuritic or exertional cp,  orthopnea pnd or arm/hand swelling  or leg swelling, presyncope,  palpitations, abdominal pain, anorexia, nausea, vomiting, diarrhea  or change in bowel habits or change in bladder habits, change in stools or change in urine, dysuria, hematuria,  rash, arthralgias, visual complaints, headache, numbness, weakness or ataxia or problems with walking or coordination,  change in mood or  memory.        Current Meds  Medication Sig   acetaminophen  (TYLENOL ) 500 MG tablet Take 500 mg by mouth 3 (three) times daily.   albuterol (VENTOLIN HFA) 108 (90 Base) MCG/ACT inhaler Inhale into the lungs.   ammonium lactate (LAC-HYDRIN) 12 % lotion Apply 1 Application topically 2 (two) times daily.   atorvastatin (LIPITOR) 80 MG tablet Take 80 mg by mouth at bedtime.   budesonide -formoterol  (SYMBICORT ) 80-4.5 MCG/ACT inhaler Take 1 puff 4 x daily   famotidine  (PEPCID ) 20 MG tablet One after supper   gabapentin  (NEURONTIN ) 300 MG capsule Take 600 mg by mouth at bedtime.   HUMIRA PEN 40 MG/0.8ML PNKT Inject 40 mg into the skin every 14 (fourteen) days.   losartan -hydrochlorothiazide  (HYZAAR) 50-12.5 MG per tablet Take 1 tablet by mouth at bedtime.   pantoprazole  (PROTONIX ) 40 MG tablet Take 1 tablet (40 mg total) by mouth daily.            Past Medical History:  Diagnosis Date   Arthritis    Borderline hypertension    Eosinophilic esophagitis    GERD (gastroesophageal reflux disease)    Hyperlipidemia    Hypertension      Objective:    Wts  05/15/2024        191  01/08/2024        189   10/04/2023        187  08/16/2023          190  08/22/2022        196  05/20/2022      195  02/15/2022         200   12/09/21 194 lb 9.6 oz (88.3 kg)  06/11/21 196 lb 1.9 oz (89 kg)  06/04/19 190 lb (86.2 kg)     Vital signs reviewed  05/15/2024  - Note at rest 02  sats  96% on RA   General appearance:    somber amb wm nad  / occ throat clearing    HEENT : Oropharynx  clear/ no pnd or cobblestoning      Nasal turbinates nl    NECK :  without  apparent JVD/ palpable Nodes/TM    LUNGS: no acc muscle use,  Nl contour chest which is clear to A and P bilaterally without cough on insp or exp maneuvers   CV:  RRR  no s3 or murmur or increase in P2, and no edema   ABD:  soft and nontender   MS:  Gait nl   ext warm without deformities Or obvious joint restrictions  calf tenderness, cyanosis or clubbing    SKIN: warm and dry without lesions    NEURO:  alert, approp, nl sensorium with  no motor or cerebellar deficits apparent.       Assessment   Assessment & Plan Cough variant asthma Wheeze resolved on symb 160 but has slight throat irritation/ throat clearing  - 05/20/2022  After extensive coaching inhaler device,  effectiveness =    80% so try symb 80 2bid and continue max gerd rx - 08/16/2023  After extensive coaching inhaler device,  effectiveness =    90% restart symbicort  80 but do 1 qid  due to cramps on 2 bid > resolved as of 01/08/2024 > no change in symbicort  80 qid  - 01/08/2024 added gabapentin  100 mg q am to the 300 at bedtime he's already taking to see if throat clearling improves (plus hard rock candy) - 05/15/2024 apparently never took the gabapentin  100 and instead using 300 mg x 2 at hs s cns issues so rec add gabapentin  300 mg in am on days off to see if tolerates and cough better controlled  -  05/15/2024  After extensive coaching inhaler device,  effectiveness =    60% with hfa (short Ti)>  rec continue symb 80 up to qid   Combination of cough variant asthma and UACS hard to manage as the higher doses of ICS cause more cough and the lower doses if used 2bid cause leg cramping so I have customized his rx per AVS  F/u can be yearly but sooner if cough worsens in meantime.      Each maintenance medication was reviewed in detail  including emphasizing most importantly the difference between maintenance and prns and under what circumstances the prns are to be triggered using an action plan format where appropriate.  Total time for H and P, chart review, counseling, reviewing hfa device(s) and generating customized AVS unique to this office visit / same day charting = 31 min summary f/u ov          AVS  Patient Instructions  Budesonide  (symbicort ) up to one puff 4 x daily   Use your albuterol as a rescue medication to be used if you can't catch your breath by resting, slowing your pace,  or doing a relaxed purse lip breathing pattern.  - The less you use it, the better it will work when you need it. - Ok to use up to 2 puffs  every 4 hours if you must but call for  appointment if use goes up over your usual need - Don't leave home without it !!  (think of it like a spare tire or starter fluid for your car)   Try gabapentin   300 one  extra pill in am when not working to see if helps cough without making you too sleepy  and if so I can write for the extra dose on the next bottle so you take it that way daily   For cough/ congestion >   mucinex dm  up to maximum of  1200 mg every 12 hours as needed  Work on inhaler technique:  relax and gently blow all the way out then take a nice smooth full deep breath back in, triggering the inhaler at same time you start breathing in.  Hold breath in for at least  5 seconds if you can. Blow out symbicort   thru nose. Rinse and gargle with water when done.  If mouth or throat bother you at all,  try brushing teeth/gums/tongue with arm and hammer toothpaste/ make a slurry and gargle and spit out.    Please schedule a follow up visit in 12  months but call sooner if needed        Ozell America, MD 05/15/2024

## 2024-05-15 NOTE — Patient Instructions (Addendum)
 Budesonide  (symbicort ) up to one puff 4 x daily   Use your albuterol as a rescue medication to be used if you can't catch your breath by resting, slowing your pace,  or doing a relaxed purse lip breathing pattern.  - The less you use it, the better it will work when you need it. - Ok to use up to 2 puffs  every 4 hours if you must but call for  appointment if use goes up over your usual need - Don't leave home without it !!  (think of it like a spare tire or starter fluid for your car)   Try gabapentin   300 one  extra pill in am when not working to see if helps cough without making you too sleepy  and if so I can write for the extra dose on the next bottle so you take it that way daily   For cough/ congestion >   mucinex dm  up to maximum of  1200 mg every 12 hours as needed  Work on inhaler technique:  relax and gently blow all the way out then take a nice smooth full deep breath back in, triggering the inhaler at same time you start breathing in.  Hold breath in for at least  5 seconds if you can. Blow out symbicort   thru nose. Rinse and gargle with water when done.  If mouth or throat bother you at all,  try brushing teeth/gums/tongue with arm and hammer toothpaste/ make a slurry and gargle and spit out.    Please schedule a follow up visit in 12  months but call sooner if needed
